# Patient Record
Sex: Female | Born: 1965 | Race: Asian | Hispanic: No | Marital: Married | Smoking: Never smoker
Health system: Southern US, Community
[De-identification: ages and names within clinical notes are randomized; demographics above are authoritative.]

## PROBLEM LIST (undated history)

## (undated) DIAGNOSIS — E785 Hyperlipidemia, unspecified: Secondary | ICD-10-CM

## (undated) DIAGNOSIS — K8020 Calculus of gallbladder without cholecystitis without obstruction: Secondary | ICD-10-CM

## (undated) DIAGNOSIS — E119 Type 2 diabetes mellitus without complications: Secondary | ICD-10-CM

## (undated) HISTORY — DX: Type 2 diabetes mellitus without complications: E11.9

## (undated) HISTORY — PX: LAPAROSCOPY: SHX197

## (undated) HISTORY — DX: Hyperlipidemia, unspecified: E78.5

---

## 1997-12-19 ENCOUNTER — Inpatient Hospital Stay (HOSPITAL_COMMUNITY): Admission: AD | Admit: 1997-12-19 | Discharge: 1997-12-19 | Payer: Self-pay | Admitting: Obstetrics and Gynecology

## 1998-05-07 ENCOUNTER — Encounter: Admission: RE | Admit: 1998-05-07 | Discharge: 1998-08-05 | Payer: Self-pay | Admitting: Obstetrics and Gynecology

## 1998-07-10 ENCOUNTER — Inpatient Hospital Stay (HOSPITAL_COMMUNITY): Admission: AD | Admit: 1998-07-10 | Discharge: 1998-07-13 | Payer: Self-pay | Admitting: Obstetrics and Gynecology

## 1999-10-17 ENCOUNTER — Observation Stay (HOSPITAL_COMMUNITY): Admission: AD | Admit: 1999-10-17 | Discharge: 1999-10-18 | Payer: Self-pay | Admitting: Internal Medicine

## 1999-10-18 ENCOUNTER — Encounter: Payer: Self-pay | Admitting: Gynecology

## 1999-11-19 ENCOUNTER — Encounter: Admission: RE | Admit: 1999-11-19 | Discharge: 2000-02-17 | Payer: Self-pay | Admitting: Gynecology

## 2000-04-08 ENCOUNTER — Encounter: Admission: RE | Admit: 2000-04-08 | Discharge: 2000-07-07 | Payer: Self-pay | Admitting: Gynecology

## 2000-05-05 ENCOUNTER — Inpatient Hospital Stay (HOSPITAL_COMMUNITY): Admission: AD | Admit: 2000-05-05 | Discharge: 2000-05-07 | Payer: Self-pay | Admitting: Gynecology

## 2000-06-25 ENCOUNTER — Other Ambulatory Visit: Admission: RE | Admit: 2000-06-25 | Discharge: 2000-06-25 | Payer: Self-pay | Admitting: Gynecology

## 2001-07-01 ENCOUNTER — Other Ambulatory Visit: Admission: RE | Admit: 2001-07-01 | Discharge: 2001-07-01 | Payer: Self-pay | Admitting: Gynecology

## 2001-11-13 ENCOUNTER — Encounter: Payer: Self-pay | Admitting: Gynecology

## 2001-11-13 ENCOUNTER — Inpatient Hospital Stay (HOSPITAL_COMMUNITY): Admission: AD | Admit: 2001-11-13 | Discharge: 2001-11-13 | Payer: Self-pay | Admitting: Gynecology

## 2002-07-21 ENCOUNTER — Other Ambulatory Visit: Admission: RE | Admit: 2002-07-21 | Discharge: 2002-07-21 | Payer: Self-pay | Admitting: Gynecology

## 2003-07-27 ENCOUNTER — Other Ambulatory Visit: Admission: RE | Admit: 2003-07-27 | Discharge: 2003-07-27 | Payer: Self-pay | Admitting: Gynecology

## 2003-09-05 ENCOUNTER — Ambulatory Visit (HOSPITAL_BASED_OUTPATIENT_CLINIC_OR_DEPARTMENT_OTHER): Admission: RE | Admit: 2003-09-05 | Discharge: 2003-09-05 | Payer: Self-pay | Admitting: Gynecology

## 2004-08-27 ENCOUNTER — Other Ambulatory Visit: Admission: RE | Admit: 2004-08-27 | Discharge: 2004-08-27 | Payer: Self-pay | Admitting: Gynecology

## 2005-06-17 ENCOUNTER — Encounter: Admission: RE | Admit: 2005-06-17 | Discharge: 2005-06-17 | Payer: Self-pay | Admitting: Family Medicine

## 2006-03-19 ENCOUNTER — Other Ambulatory Visit: Admission: RE | Admit: 2006-03-19 | Discharge: 2006-03-19 | Payer: Self-pay | Admitting: Obstetrics and Gynecology

## 2010-12-18 ENCOUNTER — Encounter: Payer: Self-pay | Admitting: Family Medicine

## 2010-12-18 ENCOUNTER — Ambulatory Visit (INDEPENDENT_AMBULATORY_CARE_PROVIDER_SITE_OTHER): Payer: Self-pay | Admitting: Family Medicine

## 2010-12-18 DIAGNOSIS — Z0000 Encounter for general adult medical examination without abnormal findings: Secondary | ICD-10-CM

## 2010-12-18 DIAGNOSIS — D509 Iron deficiency anemia, unspecified: Secondary | ICD-10-CM | POA: Insufficient documentation

## 2010-12-18 DIAGNOSIS — N938 Other specified abnormal uterine and vaginal bleeding: Secondary | ICD-10-CM | POA: Insufficient documentation

## 2010-12-18 DIAGNOSIS — N926 Irregular menstruation, unspecified: Secondary | ICD-10-CM

## 2010-12-18 NOTE — Assessment & Plan Note (Signed)
Pt complains of irregular menses x 5 months.  Her menstrual cycle would skip a month and the next month it may last up to 2 wks.  She has always been regular in the past.  Her mother experienced menopause in her early 54s.  Pt may be in perimenopausal stage.  She has no history of abnormal pap smear, with last one in 04/2010 being normal (per pt). She does not have insurance but will go to apply for assistance today Jaynee Eagles).  Pt is asymptomatic and is no menstruating currently.  Will not check Hb today.

## 2010-12-18 NOTE — Assessment & Plan Note (Signed)
Last MMG was 2010.  Pt applying for [facility] card today. Will schedule for MMG as soon as she gets card.

## 2010-12-18 NOTE — Assessment & Plan Note (Signed)
Per pt's report she is taking FeSO3 325mg  daily.  Will check Cbc when pt has [facility] card.  Pt states she will make appt with me as soon as she has card.

## 2010-12-18 NOTE — Progress Notes (Signed)
  Subjective:    Patient ID: Theresa Diaz, female    DOB: Jan 12, 1966, 45 y.o.   MRN: [id number]  HPI This is [name]'s New Patient appt to our office.  She feels well but is concerned about irregular menses.  Irregular menses: Since 07/2010 (5 months) pt has had irregular menses.  She would have a cycle one month then skip a month.  When her cycle comes back it may last about 2 wks.  She also has abd cramping with menstrual cycle.  She thinks that her mother experienced menopause in her early 7s.     Review of Systems  Constitutional: Positive for activity change. Negative for fever, appetite change, fatigue and unexpected weight change.  HENT: Negative.   Respiratory: Negative for cough, choking, chest tightness and shortness of breath.   Cardiovascular: Negative for chest pain, palpitations and leg swelling.  Gastrointestinal: Negative for nausea, vomiting, abdominal pain, diarrhea, constipation, blood in stool, abdominal distention, anal bleeding and rectal pain.  Genitourinary: Positive for menstrual problem. Negative for dysuria, frequency, vaginal discharge, difficulty urinating, pelvic pain and dyspareunia.  Musculoskeletal: Negative.   Skin: Negative.   Hematological: Negative.        Objective:   Physical Exam  Constitutional: She is oriented to person, place, and time. She appears well-developed and well-nourished. No distress.  HENT:  Head: Normocephalic and atraumatic.  Right Ear: External ear normal.  Left Ear: External ear normal.  Nose: Nose normal.  Mouth/Throat: Oropharynx is clear and moist. No oropharyngeal exudate.  Eyes: Conjunctivae and EOM are normal. Pupils are equal, round, and reactive to light.  Neck: Normal range of motion. Neck supple. No thyromegaly present.  Cardiovascular: Normal rate, regular rhythm, normal heart sounds and intact distal pulses.   No murmur heard. Pulmonary/Chest: Effort normal and breath sounds normal. She has no wheezes.    Abdominal: Soft. Bowel sounds are normal. She exhibits no distension. There is no tenderness.  Musculoskeletal: She exhibits no edema.  Neurological: She is alert and oriented to person, place, and time.  Skin: No rash noted.          Assessment & Plan:

## 2011-01-08 ENCOUNTER — Ambulatory Visit (INDEPENDENT_AMBULATORY_CARE_PROVIDER_SITE_OTHER): Payer: Self-pay | Admitting: Family Medicine

## 2011-01-08 ENCOUNTER — Other Ambulatory Visit: Payer: Self-pay | Admitting: Family Medicine

## 2011-01-08 ENCOUNTER — Encounter: Payer: Self-pay | Admitting: Family Medicine

## 2011-01-08 DIAGNOSIS — D509 Iron deficiency anemia, unspecified: Secondary | ICD-10-CM

## 2011-01-08 DIAGNOSIS — N926 Irregular menstruation, unspecified: Secondary | ICD-10-CM

## 2011-01-08 DIAGNOSIS — Z1231 Encounter for screening mammogram for malignant neoplasm of breast: Secondary | ICD-10-CM

## 2011-01-08 DIAGNOSIS — Z0000 Encounter for general adult medical examination without abnormal findings: Secondary | ICD-10-CM

## 2011-01-08 LAB — CBC
HCT: 41.3 % (ref 36.0–46.0)
Hemoglobin: 13.9 g/dL (ref 12.0–15.0)
MCH: 28.8 pg (ref 26.0–34.0)
MCHC: 33.7 g/dL (ref 30.0–36.0)
MCV: 85.7 fL (ref 78.0–100.0)
Platelets: 425 10*3/uL — ABNORMAL HIGH (ref 150–400)
RBC: 4.82 MIL/uL (ref 3.87–5.11)
RDW: 14.6 % (ref 11.5–15.5)
WBC: 6.3 10*3/uL (ref 4.0–10.5)

## 2011-01-08 LAB — BASIC METABOLIC PANEL
CO2: 21 mEq/L (ref 19–32)
Calcium: 9.3 mg/dL (ref 8.4–10.5)
Glucose, Bld: 98 mg/dL (ref 70–99)
Potassium: 4 mEq/L (ref 3.5–5.3)
Sodium: 138 mEq/L (ref 135–145)

## 2011-01-08 LAB — IRON AND TIBC: %SAT: 21 % (ref 20–55)

## 2011-01-08 NOTE — Progress Notes (Signed)
  Subjective:    Patient ID: Theresa Diaz, female    DOB: [birth year], 45 y.o.   MRN: [id number]  HPI Menstrual problem: For the past 6 months her menstrual cycle has been irregular.  Usually she has cycle that lasts about 7-10 days.  Since Dec 2011 she has had alternating months when she would not have a menstrual cycle.  Dec no cycle, Jan cycle, Feb no cycle, Mar cycle, April no cycle, May cycle.  The cycle in May lasted 7 days.  She has noticed blood when she wipes after voiding.  The blood is deep red.  She denies dysuria, frequency, urgency, pelvic pain, vaginal pain, abd pain, pain with intercourse, fever, chills, nausea, vomiting, diarrhea.  She denies history of abnormal pap smear.  Her last pap was performed at a GYN office on [address].   Her mother experienced menopause during her 48s.  Pt does not have time today for pap.  She would like to return next week for pap and pelvic exam.    Iron def anemia: Pt admits to not taking iron bid as directed.  She is not sure that she has iron def anemia.  She was prescribed iron by her previous physician.  Denies fatigue, dyspnea, syncope, palpitations, syncope.  Review of Systems Per hpi     Objective:   Physical Exam  Constitutional: She is oriented to person, place, and time. She appears well-developed and well-nourished. No distress.  Neurological: She is alert and oriented to person, place, and time.          Assessment & Plan:

## 2011-01-08 NOTE — Patient Instructions (Signed)
Please make appt with Dr Janalyn Harder for next week for pap smear.   Please get mammogram.

## 2011-01-08 NOTE — Assessment & Plan Note (Signed)
Pt was Rx iron by previous physician but takes it very irregularly.  Will check cbc and fe panel today.

## 2011-01-08 NOTE — Assessment & Plan Note (Signed)
Pt endorses irregular menstrual cycles x 6 months, with alternating month when she would not have a period.  We will check CBC today.  Pt does not have time today for pelvic exam and pap.  Last pap was some time last year.  She denies history of abnormal pap.  She will rtc in 1 wk for pap and pelvic exam. Pt's mother experienced menopause in her 80s so it may be that pt is perimenopausal, but I would like to have pap to rule out malignant cause of current complaint.

## 2011-01-08 NOTE — Assessment & Plan Note (Signed)
Gave her info to schedule for MMG at [facility].  Will perform pap at next visit.

## 2011-01-14 ENCOUNTER — Encounter: Payer: Self-pay | Admitting: Family Medicine

## 2011-01-14 ENCOUNTER — Other Ambulatory Visit (HOSPITAL_COMMUNITY)
Admission: RE | Admit: 2011-01-14 | Discharge: 2011-01-14 | Disposition: A | Discharge status: Home / Self Care | Payer: Self-pay | Source: Ambulatory Visit | Attending: Family Medicine | Admitting: Family Medicine

## 2011-01-14 ENCOUNTER — Ambulatory Visit (INDEPENDENT_AMBULATORY_CARE_PROVIDER_SITE_OTHER): Payer: Self-pay | Admitting: Family Medicine

## 2011-01-14 VITALS — BP 107/73 | HR 69 | Temp 97.9°F | Ht 59.75 in | Wt 138.0 lb

## 2011-01-14 DIAGNOSIS — Z124 Encounter for screening for malignant neoplasm of cervix: Secondary | ICD-10-CM

## 2011-01-14 DIAGNOSIS — Z01419 Encounter for gynecological examination (general) (routine) without abnormal findings: Secondary | ICD-10-CM | POA: Insufficient documentation

## 2011-01-14 DIAGNOSIS — Z1231 Encounter for screening mammogram for malignant neoplasm of breast: Secondary | ICD-10-CM

## 2011-01-14 NOTE — Progress Notes (Signed)
Subjective:    Patient ID: Theresa Diaz, female    DOB: Jul 20, 1966, 45 y.o.   MRN: [id number]  HPI LMP: 12/10/10 No vaginal discharge, no lesions No contraception  MMG: pt has not gotten MMG yet. States that she has appt next week.   Review of Systems  Constitutional: Negative for chills, fatigue and unexpected weight change.  Respiratory: Negative for cough, choking and wheezing.   Cardiovascular: Negative for chest pain.  Gastrointestinal: Negative for nausea, vomiting, diarrhea, constipation and blood in stool.  Genitourinary: Negative for dysuria, frequency, hematuria, vaginal discharge and vaginal pain.  Musculoskeletal: Negative for back pain and gait problem.  Skin: Negative for rash.  Neurological: Negative for syncope, weakness and numbness.  Hematological: Does not bruise/bleed easily.  Psychiatric/Behavioral: Negative.        Objective:   Physical Exam  Constitutional: She is oriented to person, place, and time. She appears well-developed and well-nourished. No distress.  HENT:  Head: Atraumatic.  Neck: Normal range of motion. No thyromegaly present.  Cardiovascular: Normal rate, regular rhythm, normal heart sounds and intact distal pulses.   No murmur heard. Pulmonary/Chest: Effort normal and breath sounds normal. She has no wheezes.  Abdominal: Soft. Bowel sounds are normal. She exhibits no distension and no mass. There is no tenderness.  Genitourinary: There is no rash or lesion on the right labia. There is no rash or lesion on the left labia. Cervix exhibits friability. Cervix exhibits no motion tenderness. Right adnexum displays no mass and no tenderness. Left adnexum displays no mass and no tenderness. No signs of injury around the vagina. No vaginal discharge found.         3 cysts seen on cervix, at 1 o'clock, 9 o'clock, and 6 o'clock.  Musculoskeletal: Normal range of motion. She exhibits no edema and no tenderness.  Lymphadenopathy:    She has no cervical  adenopathy.       Right: No inguinal adenopathy present.       Left: No inguinal adenopathy present.  Neurological: She is alert and oriented to person, place, and time.  Psychiatric: She has a normal mood and affect.          Assessment & Plan:   Subjective:     Theresa Diaz is a 45 y.o. female and is here for a comprehensive physical exam. The patient reports no problems.  History   Social History  . Marital Status: Married    Spouse Name: Theresa Diaz    Number of Children: 2  . Years of Education: 12   Occupational History  . Waitress    Social History Main Topics  . Smoking status: Never Smoker   . Smokeless tobacco: Never Used  . Alcohol Use: No  . Drug Use: No  . Sexually Active: Yes   Other Topics Concern  . Not on file   Social History Narrative   Lives in [place] with husband Theresa Diaz, [place]) and her two sons Theresa Diaz 1999, Theresa Diaz 2001).Works as a Child psychotherapist, part time.Speaks [place] and [place]. Cell [phone]   Health Maintenance  Topic Date Due  . Tetanus/tdap  06/14/1986  . Pap Smear  04/10/2012    The following portions of the patient's history were reviewed and updated as appropriate: allergies, current medications, past family history, past medical history, past social history, past surgical history and problem list.  Review of Systems Pertinent items are noted in HPI.   Objective:    General appearance: alert, cooperative and  appears stated age Head: Normocephalic, without obvious abnormality, atraumatic Throat: lips, mucosa, and tongue normal; teeth and gums normal Neck: no adenopathy, no carotid bruit, no JVD, supple, symmetrical, trachea midline and thyroid not enlarged, symmetric, no tenderness/mass/nodules Lungs: clear to auscultation bilaterally Heart: regular rate and rhythm, S1, S2 normal, no murmur, click, rub or gallop Abdomen: soft, non-tender; bowel sounds normal; no masses,  no organomegaly Pelvic: cervix normal in  appearance, external genitalia normal, no adnexal masses or tenderness, no cervical motion tenderness, rectovaginal septum normal, uterus normal size, shape, and consistency and vagina normal without discharge Pulses: 2+ and symmetric Skin: Skin color, texture, turgor normal. No rashes or lesions    Assessment:    Healthy female exam.       Plan:

## 2011-01-15 ENCOUNTER — Encounter: Payer: Self-pay | Admitting: Family Medicine

## 2011-01-21 ENCOUNTER — Ambulatory Visit (HOSPITAL_COMMUNITY)
Admission: RE | Admit: 2011-01-21 | Discharge: 2011-01-21 | Disposition: A | Discharge status: Home / Self Care | Payer: Self-pay | Source: Ambulatory Visit | Attending: Family Medicine | Admitting: Family Medicine

## 2011-01-21 DIAGNOSIS — Z1231 Encounter for screening mammogram for malignant neoplasm of breast: Secondary | ICD-10-CM | POA: Insufficient documentation

## 2011-06-15 ENCOUNTER — Ambulatory Visit (INDEPENDENT_AMBULATORY_CARE_PROVIDER_SITE_OTHER): Payer: Self-pay | Admitting: Family Medicine

## 2011-06-15 ENCOUNTER — Encounter: Payer: Self-pay | Admitting: Family Medicine

## 2011-06-15 ENCOUNTER — Other Ambulatory Visit: Payer: Self-pay | Admitting: Family Medicine

## 2011-06-15 VITALS — BP 120/78 | HR 91 | Temp 98.4°F | Ht 59.75 in | Wt 135.0 lb

## 2011-06-15 DIAGNOSIS — N926 Irregular menstruation, unspecified: Secondary | ICD-10-CM

## 2011-06-15 NOTE — Progress Notes (Signed)
  Subjective:    Patient ID: Theresa Diaz, female    DOB: 09/12/1965, 45 y.o.   MRN: [id number]  HPI 45 yo female who presents with dysfunctional uterine bleeding. She reports having period for the last two weeks. It just stopped today. For 8-9 days of that time, she required up to 13 pads. She denies any abdominal pain associated with this. She has been having intermittent skipped periods for the last 9 months. May: no menses, June: menses, July: no menses, August: menses, September: 1-2 days of spotting. She reports not ever having a period that has lasted this long. Her normal menses would last up to 7 days where she would use 3-4 pads/day. They used to be regular, every 28days. Menarche was at 45 yo. She is sexually active. She does occasionally have hot flashes that started 4-5 months ago. She has been experiencing fatigue and some lightheadedness in the last 2 weeks. Her mother had menopause in her 37's. She denies any family history of uterine, ovarian, colon or breast cancer. She has two children.  She denies any hematuria, any dysuria.  She had a pap smear in June 2012 which was normal. She also had iron studies and CBC which were normal at the time.    Review of Systems Negative except per HPI.     Objective:   Physical Exam Physical Examination: General appearance - alert, well appearing, and in no distress Eyes - pupils equal and reactive, extraocular eye movements intact, red conjuctiva Chest - clear to auscultation, no wheezes, rales or rhonchi, symmetric air entry Heart - normal rate, regular rhythm, normal S1, S2, no murmurs, rubs, clicks or gallops Abdomen - soft, nontender, nondistended, no masses or organomegaly Extremities - peripheral pulses normal, no pedal edema, no clubbing or cyanosis       Assessment & Plan:

## 2011-06-15 NOTE — Patient Instructions (Signed)
I am checking blood tests to make sure that your hemoglobin and your thyroid are normal.  This is probably just the beginning of menopause but to make sure that it's not something mor serious, we're going to take samples of cells called:endometrial biopsy in 2-3 weeks. So make appointment in 2-3 weeks for that.

## 2011-06-15 NOTE — Assessment & Plan Note (Addendum)
Perimenopause vs endometrial hyperplasia/carcinoma: in light of her family history of early menopause as well as with her description of hot flashes, this is most likely perimenopausal. However, it could also be endometrial hyperplasia/cancer (despite no risk factors such as family history of endometrial, cervical, ovarian, breast or colon cancers, despite no obesity). Patient was asked to reschedule appointment for endometrial biopsy in 2-3 weeks. If bleeding starts again, we can start her on progesterone. Will also check TSH and prolactin. Will also check CBC since she had significant bleeding and did report some symptoms. Pt advised to continue taking her iron.

## 2011-06-16 ENCOUNTER — Other Ambulatory Visit: Payer: Self-pay | Admitting: Family Medicine

## 2011-06-16 DIAGNOSIS — D509 Iron deficiency anemia, unspecified: Secondary | ICD-10-CM

## 2011-06-16 LAB — PROLACTIN: Prolactin: 10.2 ng/mL

## 2011-06-16 LAB — CBC
HCT: 26.2 % — ABNORMAL LOW (ref 36.0–46.0)
Hemoglobin: 8.2 g/dL — ABNORMAL LOW (ref 12.0–15.0)
RBC: 2.99 MIL/uL — ABNORMAL LOW (ref 3.87–5.11)
WBC: 6 10*3/uL (ref 4.0–10.5)

## 2011-06-16 MED ORDER — FERROUS SULFATE 325 (65 FE) MG PO TABS: 325.0000 mg | ORAL_TABLET | Freq: Three times a day (TID) | ORAL | Status: DC

## 2011-06-16 NOTE — Telephone Encounter (Signed)
Called patient to let her know that her hemoglobin was low and to start taking ferrous sulfate three times daily, not with meals initially. If she starts developing upper GI symptoms, can start taking it with meals. Also added Retic and ferritin, TIBC to her CBC.  Patient has appointment on Thursday November 15th for endometrial biopsy. Will recheck CBC, retic and Ferritin then.

## 2011-06-17 LAB — IRON AND TIBC
%SAT: 4 % — ABNORMAL LOW (ref 20–55)
Iron: 14 ug/dL — ABNORMAL LOW (ref 42–145)
TIBC: 338 ug/dL (ref 250–470)
UIBC: 324 ug/dL (ref 125–400)

## 2011-06-25 ENCOUNTER — Encounter: Payer: Self-pay | Admitting: Family Medicine

## 2011-06-25 ENCOUNTER — Ambulatory Visit (INDEPENDENT_AMBULATORY_CARE_PROVIDER_SITE_OTHER): Payer: Self-pay | Admitting: Family Medicine

## 2011-06-25 VITALS — BP 108/76 | HR 86 | Temp 98.2°F | Ht 59.75 in | Wt 139.0 lb

## 2011-06-25 DIAGNOSIS — N949 Unspecified condition associated with female genital organs and menstrual cycle: Secondary | ICD-10-CM

## 2011-06-25 DIAGNOSIS — D509 Iron deficiency anemia, unspecified: Secondary | ICD-10-CM

## 2011-06-25 DIAGNOSIS — N938 Other specified abnormal uterine and vaginal bleeding: Secondary | ICD-10-CM

## 2011-06-25 LAB — POCT URINE PREGNANCY: Preg Test, Ur: NEGATIVE

## 2011-06-26 NOTE — Progress Notes (Signed)
  Subjective:    Patient ID: Theresa Diaz, female    DOB: 1965-11-16, 45 y.o.   MRN: [id number]  HPI 45 year old female with dysfunctional uterine bleeding who presented for followup to get endometrial biopsy. However, upon signing of consent forms, patient expressed not wanting to have procedure done in the office. She did not seem to understand from her previous visit that this was a biopsy of her uterus. She said that she did not think that anything was wrong. she said that this was most likely due to menopause and that a lot of her friends and family had gone through similar situation than her. With the use of the interpreter phone for [place] she was able to communicate that she did not think that there was any need for any further workup. She denied any pain and said that when she did have some pain she would be more concerned for cancer.  She denied any current bleeding. The last time she bled was at her last office visit. She reports having been taking the iron 3 times a day. She denies any constipation and has been eating a lot of fruits and vegetables which she attributes to helping with her constipation. she denies any lightheadedness or dizziness or significant fatigue.   Review of Systems    negative except per history of present illness Objective:   Physical Exam  Filed Vitals:   06/25/11 1002  BP: 108/76  Pulse: 86  Temp: 98.2 F (36.8 C)   Physical Examination: Eyes - conjunctiva not pale Chest - clear to auscultation, no wheezes, rales or rhonchi, symmetric air entry Heart - normal rate, regular rhythm, normal S1, S2, no murmurs, rubs, clicks or gallops       Assessment & Plan:

## 2011-06-26 NOTE — Assessment & Plan Note (Addendum)
Patient no longer symptomatic and not actively bleeding. Continue ferrous sulfate 325 mg 3 times daily. Recheck CBC in 2-3 months.

## 2011-06-26 NOTE — Assessment & Plan Note (Signed)
Discussed patient with Dr. Jennette Kettle. Patient is being evaluated for dysfunctional uterine bleeding that could be anovulatory in nature in which case endometrial biopsy would be needed. Patient was explained of the possibility of uterine dysplasia or cancer with irregular bleeding. However since patient's did not think that this was a serious problem other than menopause she did not want to have any biopsy at the moment. She did say that if things were to worsen she would consider it. Patient did agree to having ultrasound done. This would be done to evaluate for any fibroids or to evaluate for a large endometrial stripe. It could also be that the patient is perimenopausal. Will continue with ferrous sulfate 325 mg 3 times a day since patient is tolerating it well. Will repeat CBC in 2-3 months.

## 2011-07-07 ENCOUNTER — Ambulatory Visit (HOSPITAL_COMMUNITY): Payer: Self-pay

## 2011-08-25 ENCOUNTER — Ambulatory Visit: Payer: Self-pay | Admitting: Family Medicine

## 2011-09-04 ENCOUNTER — Encounter: Payer: Self-pay | Admitting: Family Medicine

## 2011-09-04 ENCOUNTER — Ambulatory Visit (INDEPENDENT_AMBULATORY_CARE_PROVIDER_SITE_OTHER): Payer: Self-pay | Admitting: Family Medicine

## 2011-09-04 VITALS — BP 124/81 | HR 84 | Ht 59.75 in | Wt 137.6 lb

## 2011-09-04 DIAGNOSIS — D509 Iron deficiency anemia, unspecified: Secondary | ICD-10-CM

## 2011-09-04 DIAGNOSIS — D649 Anemia, unspecified: Secondary | ICD-10-CM

## 2011-09-04 DIAGNOSIS — R42 Dizziness and giddiness: Secondary | ICD-10-CM

## 2011-09-04 NOTE — Patient Instructions (Addendum)
I return in 1 month to see Dr. Gwenlyn Saran.  Or sooner if  Any new or worsening of symptoms. Keep a log book/paper- the date of when dizziness occurs, how long it lasted and other symptoms that occurred at the same, what were you doing, how did you make it go away.  Keep taking iron.  I will check your blood today to see if anemia is improving.

## 2011-09-09 DIAGNOSIS — R42 Dizziness and giddiness: Secondary | ICD-10-CM | POA: Insufficient documentation

## 2011-09-09 NOTE — Progress Notes (Signed)
  Subjective:    Patient ID: Theresa Diaz, female    DOB: April 26, 1966, 46 y.o.   MRN: [id number]  HPI Dizziness: Patient reports dizziness off and on x2 years. But seem to be getting more frequent. Episodes occur now 4-5 times per week. Last approximately one to 2 minutes and then resolve spontaneously. No symptoms of vertigo. No dizziness upon standing. Patient states that bright light and bright colors can bring the dizziness on. Looking away from bright lights and colors will make the dizziness improved. No blurry vision. No vision loss. No headaches. No history of seizures. No dark or bloody stools. Has had cold symptoms off and on since around Christmas. Does have a history of anemia. No syncope. No falls. No temporal pain. Lifting arms above head or lifting her shoulder does not provoke symptoms.   Review of Systems As per above.    Objective:   Physical Exam  Constitutional: She appears well-developed and well-nourished.  HENT:  Head: Normocephalic and atraumatic.  Right Ear: External ear normal.  Left Ear: External ear normal.  Nose: Nose normal.  Mouth/Throat: Oropharynx is clear and moist. No oropharyngeal exudate.  Eyes: Conjunctivae and EOM are normal. Pupils are equal, round, and reactive to light. Right eye exhibits no discharge. Left eye exhibits no discharge. No scleral icterus.  Neck: Normal range of motion. Neck supple.  Pulmonary/Chest: Effort normal and breath sounds normal. No respiratory distress. She has no wheezes.  Abdominal: Soft. She exhibits no distension.  Lymphadenopathy:    She has no cervical adenopathy.  Neurological: She is alert. She has normal reflexes. She displays normal reflexes. No cranial nerve deficit. She exhibits normal muscle tone. Coordination normal.       Dix-Hallpike maneuver negative.  No nystagmus.  Psychiatric: She has a normal mood and affect.          Assessment & Plan:

## 2011-09-09 NOTE — Assessment & Plan Note (Addendum)
Will recheck CBC today to make sure that the anemia improved-anemia can contribute to dizziness.  Patient is to continue iron. PCP to decide if this should be DC'd after CBC return.

## 2011-09-09 NOTE — Assessment & Plan Note (Signed)
Cause of dizziness unclear at this time. I do not think that this is an emergent issue since it has been going on x2 years. Also neuro exam within normal limits. Patient is to return in 1 month to see Dr. Alinda Sierras primary care doctor.  Or sooner if  Any new or worsening of symptoms. For the next month-patient is to Keep a log of: the date of when dizziness occurs, how long it lasted and other symptoms that occurred at the same, what were you doing, how did you make it go away.

## 2011-10-08 ENCOUNTER — Encounter: Payer: Self-pay | Admitting: Family Medicine

## 2011-10-08 ENCOUNTER — Ambulatory Visit (INDEPENDENT_AMBULATORY_CARE_PROVIDER_SITE_OTHER): Payer: Self-pay | Admitting: Family Medicine

## 2011-10-08 DIAGNOSIS — D649 Anemia, unspecified: Secondary | ICD-10-CM

## 2011-10-08 DIAGNOSIS — Z0000 Encounter for general adult medical examination without abnormal findings: Secondary | ICD-10-CM

## 2011-10-08 DIAGNOSIS — N938 Other specified abnormal uterine and vaginal bleeding: Secondary | ICD-10-CM

## 2011-10-08 DIAGNOSIS — N926 Irregular menstruation, unspecified: Secondary | ICD-10-CM

## 2011-10-08 NOTE — Patient Instructions (Signed)
I will call you with the results of the blood test. If you are still anemic, we will continue with the iron. If not, no more need for iron.

## 2011-10-09 LAB — LDL CHOLESTEROL, DIRECT: Direct LDL: 137 mg/dL — ABNORMAL HIGH

## 2011-10-09 LAB — CBC
HCT: 33.6 % — ABNORMAL LOW (ref 36.0–46.0)
Hemoglobin: 10.1 g/dL — ABNORMAL LOW (ref 12.0–15.0)
MCHC: 30.1 g/dL (ref 30.0–36.0)
RBC: 4.45 MIL/uL (ref 3.87–5.11)
WBC: 7.5 10*3/uL (ref 4.0–10.5)

## 2011-10-10 NOTE — Assessment & Plan Note (Signed)
Dysfunctional bleeding appears to have resolved. At 45yo this could be due to irregular bleeding from menopause. Patient never got transvaginal ultrasound, which she should probably obtain at some point.  Will check repeat CBC to assess anemia and whether she needs to take more iron.

## 2011-10-10 NOTE — Progress Notes (Signed)
Patient ID: Christianne Borrow, female   DOB: 02-19-1966, 46 y.o.   MRN: [id number] Patient ID: Rozena Fierro    DOB: 1965-11-15, 46 y.o.   MRN: [id number] --- Subjective:  Allyssa is a 46 y.o.female who presents for follow up on vaginal bleeding and anemia that occurred in November 2012. She has been taking iron 2-3 times per week. She denies any lightheadedness and fatigue. She has not had any more episodes of vaginal bleeding. Reports normal periods.  She has not gone to get transvaginal ultrasound. She denies any recent weight loss or abdominal pain.    Objective: Filed Vitals:   10/08/11 1630  BP: 120/80  Pulse: 80    Physical Examination:   General appearance - alert, well appearing, and in no distress Eyes - pink palpebral conjunctiva Chest - clear to auscultation, no wheezes, rales or rhonchi, symmetric air entry Heart - normal rate, regular rhythm, normal S1, S2, no murmurs, rubs, clicks or gallops Abdomen - soft, nontender, nondistended, no masses or organomegaly Extremities - peripheral pulses normal, no pedal edema, no clubbing or cyanosis

## 2011-10-12 ENCOUNTER — Telehealth: Payer: Self-pay | Admitting: Family Medicine

## 2011-10-12 DIAGNOSIS — D509 Iron deficiency anemia, unspecified: Secondary | ICD-10-CM

## 2011-10-12 MED ORDER — FERROUS SULFATE 325 (65 FE) MG PO TABS: 325.0000 mg | ORAL_TABLET | Freq: Three times a day (TID) | ORAL | Status: DC

## 2011-10-12 NOTE — Telephone Encounter (Signed)
Called patient to let her know that her hemoglobin was still low at 10.1, although improved compared to previous at 8.4. She denies any lightheadedness and dizziness. She states that her period is back to normal. Advised patient to return in 1 month to check CBC again.

## 2011-12-01 ENCOUNTER — Ambulatory Visit (INDEPENDENT_AMBULATORY_CARE_PROVIDER_SITE_OTHER): Payer: Self-pay | Admitting: Family Medicine

## 2011-12-01 ENCOUNTER — Encounter: Payer: Self-pay | Admitting: Family Medicine

## 2011-12-01 ENCOUNTER — Ambulatory Visit (HOSPITAL_COMMUNITY)
Admission: RE | Admit: 2011-12-01 | Discharge: 2011-12-01 | Disposition: A | Discharge status: Home / Self Care | Payer: Self-pay | Source: Ambulatory Visit | Attending: Family Medicine | Admitting: Family Medicine

## 2011-12-01 VITALS — BP 113/78 | HR 85 | Temp 98.0°F | Ht 59.75 in | Wt 143.0 lb

## 2011-12-01 DIAGNOSIS — R42 Dizziness and giddiness: Secondary | ICD-10-CM | POA: Insufficient documentation

## 2011-12-01 LAB — CBC
Hemoglobin: 12.1 g/dL (ref 12.0–15.0)
MCHC: 31.4 g/dL (ref 30.0–36.0)
Platelets: 406 10*3/uL — ABNORMAL HIGH (ref 150–400)
RDW: 23.7 % — ABNORMAL HIGH (ref 11.5–15.5)

## 2011-12-01 NOTE — Assessment & Plan Note (Addendum)
Cause of dizziness still unclear. She does not complain of palpitations, chest pain, shortness of breath. She has a normal O2 sat. she is a normal EKG. She is a normal neuro exam. She is a normal gait with walking. Patient states that she has some blurry vision with distance. However, visual acuity was normal today. Her blood pressure does not drop with standing. I will check a CBC because she has a history of anemia. She has not been taking her iron as prescribed. I will have her followup with her PCP next week.

## 2011-12-01 NOTE — Patient Instructions (Signed)
Your EKG looks fine I will send you to check your blood counts I want you to return in one to 2 weeks to see Dr. Gwenlyn Saran I will call you if I need you to restart your iron  If this dizziness gets worse or doesn't go away immediately, give Theresa Diaz a call or be seen for evaluation

## 2011-12-01 NOTE — Progress Notes (Signed)
  Subjective:    Patient ID: Theresa Diaz, female    DOB: [birth year], 46 y.o.   MRN: [id number]  HPI Patient presents today with dizziness. She's had this complaint before and was evaluated by Dr. Edmonia James but it got better. She has been experiencing this again over last 3 weeks. When she is walking she occasionally feels like she is going to fall. She says that there are spots in her vision and she has blurred vision when looking at distances. This gets better when she closes her eyes and weights for a moment. She denies chest pain, shortness of breath, weakness when this happens. She has not experienced any weakness in her hands or feet. She does not think her muscles are weak. She does not think her dizziness is positional. It is not worse when she lies down or moves her head.  Her last period was 3/15 and lasted for 2-3 days. This month she has had some spotting but no period yet. She takes her iron once a day when she remembers. She has a history of iron deficiency anemia. Last CBC showed a hemoglobin of 10.  Review of Systems    see above, otherwise negative Objective:   Physical Exam  Vital signs reviewed General appearance - alert, well appearing, and in no distress Heart - normal rate, regular rhythm, normal S1, S2, no murmurs, rubs, clicks or gallops Chest - clear to auscultation, no wheezes, rales or rhonchi, symmetric air entry, no tachypnea, retractions or cyanosis Neurological - alert, oriented, normal speech, no focal findings or movement disorder noted, neck supple without rigidity, cranial nerves II through XII intact Gait is normal       Assessment & Plan:

## 2012-02-02 ENCOUNTER — Other Ambulatory Visit: Payer: Self-pay | Admitting: Emergency Medicine

## 2012-02-02 DIAGNOSIS — Z1231 Encounter for screening mammogram for malignant neoplasm of breast: Secondary | ICD-10-CM

## 2012-02-03 ENCOUNTER — Ambulatory Visit (HOSPITAL_COMMUNITY)
Admission: RE | Admit: 2012-02-03 | Discharge: 2012-02-03 | Disposition: A | Discharge status: Home / Self Care | Payer: Self-pay | Source: Ambulatory Visit | Attending: Family Medicine | Admitting: Family Medicine

## 2012-02-03 DIAGNOSIS — Z1231 Encounter for screening mammogram for malignant neoplasm of breast: Secondary | ICD-10-CM | POA: Insufficient documentation

## 2012-02-15 ENCOUNTER — Encounter: Payer: Self-pay | Admitting: Family Medicine

## 2012-02-15 ENCOUNTER — Other Ambulatory Visit (HOSPITAL_COMMUNITY)
Admission: RE | Admit: 2012-02-15 | Discharge: 2012-02-15 | Disposition: A | Discharge status: Home / Self Care | Payer: Self-pay | Source: Ambulatory Visit | Attending: Family Medicine | Admitting: Family Medicine

## 2012-02-15 ENCOUNTER — Ambulatory Visit (INDEPENDENT_AMBULATORY_CARE_PROVIDER_SITE_OTHER): Payer: Self-pay | Admitting: Family Medicine

## 2012-02-15 VITALS — BP 106/71 | HR 71 | Temp 98.1°F | Ht 60.0 in | Wt 142.0 lb

## 2012-02-15 DIAGNOSIS — Z0000 Encounter for general adult medical examination without abnormal findings: Secondary | ICD-10-CM

## 2012-02-15 DIAGNOSIS — Z124 Encounter for screening for malignant neoplasm of cervix: Secondary | ICD-10-CM

## 2012-02-15 DIAGNOSIS — D509 Iron deficiency anemia, unspecified: Secondary | ICD-10-CM

## 2012-02-15 DIAGNOSIS — Z01419 Encounter for gynecological examination (general) (routine) without abnormal findings: Secondary | ICD-10-CM | POA: Insufficient documentation

## 2012-02-15 NOTE — Progress Notes (Signed)
Patient ID: Theresa Diaz, female   DOB: Jun 27, 1966, 46 y.o.   MRN: [id number] Patient ID: Theresa Diaz    DOB: September 24, 1965, 46 y.o.   MRN: [id number] --- Subjective:  Theresa Diaz is a 46 y.o.female who presents for annual physical.  Denies any vaginal discharge, odor. Had some heavy bleeding earlier on this year, but this has since resolved. Currently, menses last 2 days of moderate bleeding.   Periods have been more irregular. Sometimes skips period in a month. Before this last year, was regular having periods every month. LMP: 01/18/12 No history of STD's. Sexually active with her husband. She doesn't suspect that husband has STD or is not exclusive with her.  Denies fatigue, shortness of breath, chest pain. Denies recent weight loss. Denies abdominal pain, constipation, diarrhea or blood in stool. No joint pain or skin rashes.   ROS: see HPI Past Medical History: reviewed and updated medications and allergies. Social History: Tobacco: none  Objective: Filed Vitals:   02/15/12 0846  BP: 106/71  Pulse: 71  Temp: 98.1 F (36.7 C)    Physical Examination:   General appearance - alert, well appearing, and in no distress Nose - normal and patent, mildly erythematous nasal turbinates, otherwise no discharge or polyps Mouth - mucous membranes moist, pharynx normal without lesions Neck - supple, no significant adenopathy no masses Chest - clear to auscultation, no wheezes, rales or rhonchi, symmetric air entry Heart - normal rate, regular rhythm, normal S1, S2, no murmurs, rubs, clicks or gallops Abdomen - soft, nontender, nondistended, no masses or organomegaly Extremities - peripheral pulses normal, no pedal edema, no clubbing or cyanosis Pelvic exam: normal external genitalia, vulva, vagina, cervix, uterus and adnexa. Minimal blood present at cervical os. Breast exam: no masses, tenderness or nipple discharge. Normal appearing skin  Labs and imaging: 02/03/12: mammogram: wnl  CBC      Component Value Date/Time   WBC 6.7 12/01/2011 1011   RBC 5.02 12/01/2011 1011   HGB 12.1 12/01/2011 1011   HCT 38.5 12/01/2011 1011   PLT 406* 12/01/2011 1011   MCV 76.7* 12/01/2011 1011   MCH 24.1* 12/01/2011 1011   MCHC 31.4 12/01/2011 1011   RDW 23.7* 12/01/2011 1011   CMP     Component Value Date/Time   NA 138 01/08/2011 1030   K 4.0 01/08/2011 1030   CL 107 01/08/2011 1030   CO2 21 01/08/2011 1030   GLUCOSE 98 01/08/2011 1030   BUN 7 01/08/2011 1030   CREATININE 0.63 01/08/2011 1030   CALCIUM 9.3 01/08/2011 1030

## 2012-02-15 NOTE — Patient Instructions (Signed)
It was nice to see you today! I will send you a letter with the results of the pap smear.  Also, I will send a referral for the dentist, but I don't know if it will go through. We will try.

## 2012-02-15 NOTE — Assessment & Plan Note (Signed)
Resolved. Hemoglobin at 12.1 from 8 in November. Not taking iron supplements, but eating iron rich foods. Continue with dietary modifications especially since patient is no longer having active uterine bleeding.

## 2012-02-15 NOTE — Assessment & Plan Note (Addendum)
PAP today. No indications for STD testing.  Mammogram June 2013 normal.  Patient requests referral for dental clinic for cleaning.

## 2012-02-16 ENCOUNTER — Encounter: Payer: Self-pay | Admitting: Family Medicine

## 2012-02-17 ENCOUNTER — Encounter: Payer: Self-pay | Admitting: Family Medicine

## 2012-07-27 ENCOUNTER — Ambulatory Visit (INDEPENDENT_AMBULATORY_CARE_PROVIDER_SITE_OTHER): Payer: No Typology Code available for payment source | Admitting: Family Medicine

## 2012-07-27 ENCOUNTER — Telehealth: Payer: Self-pay | Admitting: Family Medicine

## 2012-07-27 ENCOUNTER — Encounter: Payer: Self-pay | Admitting: Family Medicine

## 2012-07-27 VITALS — BP 129/80 | HR 68 | Temp 98.4°F | Ht 60.0 in | Wt 146.0 lb

## 2012-07-27 DIAGNOSIS — R42 Dizziness and giddiness: Secondary | ICD-10-CM | POA: Insufficient documentation

## 2012-07-27 LAB — POCT HEMOGLOBIN: Hemoglobin: 12.8 g/dL (ref 12.2–16.2)

## 2012-07-27 MED ORDER — MECLIZINE HCL 12.5 MG PO TABS: 12.5000 mg | ORAL_TABLET | Freq: Three times a day (TID) | ORAL | Status: DC | PRN

## 2012-07-27 MED ORDER — MECLIZINE HCL 32 MG PO TABS: 32.0000 mg | ORAL_TABLET | Freq: Three times a day (TID) | ORAL | Status: DC | PRN

## 2012-07-27 NOTE — Telephone Encounter (Signed)
Called pt. Waiting for call back. Please tell pt: Hemoglobin was normal. Pt needs to take meclizine and f/up in 4 weeks. (see instructions) .Arlyss Repress

## 2012-07-27 NOTE — Assessment & Plan Note (Addendum)
Differential includes anemia vs benign position paroxysmal vertigo vs orthostatic hypotension. No evidence of neural deficit on neuro exam. No nystagmus. Negative dix hallpike.  Check hemoglobin in context of h/o anemia. Orthostatics normal. Treat empirically with meclizine and follow up in 4 weeks or sooner if not improved.

## 2012-07-27 NOTE — Patient Instructions (Addendum)
Please follow up in 4 weeks to see how things are going.   Dizziness Dizziness is a common problem. It is a feeling of unsteadiness or lightheadedness. You may feel like you are about to faint. Dizziness can lead to injury if you stumble or fall. A person of any age group can suffer from dizziness, but dizziness is more common in older adults. CAUSES  Dizziness can be caused by many different things, including:  Middle ear problems.  Standing for too long.  Infections.  An allergic reaction.  Aging.  An emotional response to something, such as the sight of blood.  Side effects of medicines.  Fatigue.  Problems with circulation or blood pressure.  Excess use of alcohol, medicines, or illegal drug use.  Breathing too fast (hyperventilation).  An arrhythmia or problems with your heart rhythm.  Low red blood cell count (anemia).  Pregnancy.  Vomiting, diarrhea, fever, or other illnesses that cause dehydration.  Diseases or conditions such as Parkinson's disease, high blood pressure (hypertension), diabetes, and thyroid problems.  Exposure to extreme heat. DIAGNOSIS  To find the cause of your dizziness, your caregiver may do a physical exam, lab tests, radiologic imaging scans, or an electrocardiography test (ECG).  TREATMENT  Treatment of dizziness depends on the cause of your symptoms and can vary greatly. HOME CARE INSTRUCTIONS   Drink enough fluids to keep your urine clear or pale yellow. This is especially important in very hot weather. In the elderly, it is also important in cold weather.  If your dizziness is caused by medicines, take them exactly as directed. When taking blood pressure medicines, it is especially important to get up slowly.  Rise slowly from chairs and steady yourself until you feel okay.  In the morning, first sit up on the side of the bed. When this seems okay, stand slowly while holding onto something until you know your balance is  fine.  If you need to stand in one place for a long time, be sure to move your legs often. Tighten and relax the muscles in your legs while standing.  If dizziness continues to be a problem, have someone stay with you for a day or two. Do this until you feel you are well enough to stay alone. Have the person call your caregiver if he or she notices changes in you that are concerning.  Do not drive or use heavy machinery if you feel dizzy.  Do not drink alcohol. SEEK IMMEDIATE MEDICAL CARE IF:   Your dizziness or lightheadedness gets worse.  You feel nauseous or vomit.  You develop problems with talking, walking, weakness, or using your arms, hands, or legs.  You are not thinking clearly or you have difficulty forming sentences. It may take a friend or family member to determine if your thinking is normal.  You develop chest pain, abdominal pain, shortness of breath, or sweating.  Your vision changes.  You notice any bleeding.  You have side effects from medicine that seems to be getting worse rather than better. MAKE SURE YOU:   Understand these instructions.  Will watch your condition.  Will get help right away if you are not doing well or get worse. Document Released: 01/20/2001 Document Revised: 10/19/2011 Document Reviewed: 02/13/2011 [facility] Patient Information 2013 [place], [place].  [place] M?t  (Dizziness)  Hoa m?t l m?t v?n ?? ph? bi?n. ? l c?m gic khng ?n ??nh ho?c chng m?t. B?n c th? c?m th?y nh? s?p ng?t. Hoa m?t c th?  d?n ??n ch?n th??ng n?u b?n v?p ho?c t ng. B?t k? ?? tu?i no c?ng c th? b? hoa m?t, nh?ng hoa m?t ph? bi?n h?n ? ng??i l?n tu?i.  NGUYN NHN  Hoa m?t c th? gy b?i nhi?u nguyn nhn khc nhau, bao g?m:   V?n ?? tai gi?a.  ??ng qu lu.  Nhi?m trng.  Ph?n ?ng d? ?ng.  Lo ha.  M?t ph?n ?ng c?m xc v?i m?t ci g ?, ch?ng h?n nh? khi nhn th?y mu.  Tc d?ng ph? c?a thu?c.  M?t m?i.  V?n ?? v?i tu?n hon ho?c huy?t  p.  S? d?ng qu nhi?u r??u, thu?c ho?c ma ty b?t h?p php.  Th? qu nhanh.  R?i lo?n nh?p ho?c cc v?n ?? v? nh?p tim.  L??ng h?ng huy?t c?u th?p (thi?u mu).  [place].  Nn m?a, tiu ch?y, s?t ho?c cc b?nh khc gy ra m?t n??c.  B?nh ho?c ?i?u ki?n nh? b?nh Parkinson, huy?t p cao (t?ng xng), ti?u ???ng v cc v?n ?? v? tuy?n gip.  Ti?p xc v?i nhi?t ?? qu m?c. CH?N ?ON  ?? tm nguyn nhn gy hoa m?t, chuyn gia ch?m [place] y t? c th? khm tr?c ti?p, xt nghi?m, ch?p X-quang ho?c xt nghi?m ghi c? ?i?n (ECG).  ?I?U TR?  ?i?u tr? hoa m?t ph? thu?c vo nguyn nhn c?a tri?u ch?ng v c th? khc nhau r?t nhi?u.  H??NG D?N CH?M [place] T?I NH   U?ng ?? n??c ?? gi? cho n??c ti?u trong ho?c vng nh?t. ?i?u ny ??c bi?t quan tr?ng trong th?i ti?t r?t nng. ? ng??i gi, n c?ng l quan tr?ng trong th?i ti?t l?nh.  N?u b?n b? hoa m?t do thu?c, hy s? d?ng chng chnh xc theo ch? d?n. Khi s? d?ng thu?c tr? huy?t p, ?i?u ??c bi?t quan tr?ng l ??ng ln t? t?.  ??ng ln d?n t? gh? v t? ?n ??nh cho ??n khi b?n c?m th?y khng c v?n ?? g.  Vo bu?i sng, ??u tin ng?i d?y bn c?nh gi??ng. Khi b?n c?m th?y ?n ? t? th? ny, hy ??ng ch?m d?y trong khi n?m l?y m?t ci g ? cho ??n khi b?n th?y cn b?ng t?t.  N?u b?n c?n ??ng ? m?t n?i trong m?t th?i gian di, hy nh? di chuy?n chn th??ng xuyn. Th?t ch?t v th? gin cc c? b?p ? chn b?n khi ??ng.  N?u hoa m?t ti?p t?c l m?t v?n ??, b?n c?n ? cng v?i ai ? trong m?t ho?c hai ngy. Th?c hi?n ?i?u ny cho ??n khi b?n c?m th?y mnh ?? kh?e ?? ? m?t mnh. Yu c?u ng??i ny g?i cho chuyn gia ch?m [place] y t? c?a b?n n?u h? nh?n nh?ng thay ??i trong b?n l v?n ?? c?n quan tm.  Khng [place] xe ho?c v?n hnh my mc n?ng n?u b?n c?m th?y chng m?t.  Khng u?ng r??u. HY NGAY L?P T?C THAM V?N V?I CHUYN GIA Y T? N?U:   B?n b? hoa m?t ho?c chng m?t n?ng h?n.  B?n c?m th?y bu?n nn ho?c nn.  B?n c v?n ?? v? ni chuy?n, ?i b?, ?m y?u ho?c  vi?c s? d?ng cnh tay, bn tay hay chn.  B?n suy ngh? khng r rng ho?c b?n g?p kh kh?n khi t?o cu. C th? c?n nh? m?t ng??i b?n ho?c thnh vin gia ?nh xc ??nh xem suy ngh? c?a b?n c bnh th??ng khng.  B?n b? ?au ng?c, ?  au b?ng, th? d?c ho?c v m? hi.  Th? l?c c?a b?n thay ??i.  B?n nh?n th?y b?t k? s? ch?y mu no.  B?n c tc d?ng ph? t? thu?c c v? nh? tr? nn t?i t? h?n ch? khng ph?i l t?t h?n. ??M B?O B?N:   Hi?u cc h??ng d?n ny.  S? theo di tnh tr?ng c?a mnh.  S? yu c?u tr? gip ngay l?p t?c n?u b?n c?m th?y khng kh?e ho?c tnh tr?ng tr? nn t?i h?n. Document Released: 07/16/2011 Document Revised: 10/19/2011 [facility] Patient Information 2013 [place], [place].

## 2012-07-27 NOTE — Telephone Encounter (Signed)
Patient wants to speak to Dr. Gwenlyn Saran about her symptoms that she is still having.

## 2012-07-27 NOTE — Progress Notes (Signed)
Patient ID: Christianne Borrow    DOB: [birth year], 46 y.o.   MRN: [id number] --- Subjective:  Christle is a 46 y.o.female who presents with dizziness x 2weeks.  Feels like she is drunk, not like room is spinning. This occurs every 2-3 days, occurs when she looks up or down, not so much when she stands up. No change in vision, no ringing in the ears, no headache. No weakness. Lasts 3-4 minutes at most and goes away on its own or if she changes head position. She states that she has only felt like this when she was anemic.   ROS: see HPI Past Medical History: reviewed and updated medications and allergies. Social History: Tobacco: denies  Objective: Filed Vitals:   07/27/12 0931  BP: 122/83  Pulse: 68  Temp: 98.4 F (36.9 C)    Physical Examination:   General appearance - alert, well appearing, and in no distress Ears - bilateral TM's and external ear canals normal, right TM slightly duller than left, but no fluid level  Nose - normal and patent, mildly congested Mouth - mucous membranes moist, pharynx normal without lesions Neck - supple, no significant adenopathy Chest - clear to auscultation, no wheezes, rales or rhonchi, symmetric air entry Heart - normal rate, regular rhythm, normal S1, S2, no murmurs, rubs, clicks or gallops Neuro - CN 2-12 grossly intact, 5/5 strength in upper and lower extremities bilaterally. No nystagmus appreciated Dix- Hallpike: negative

## 2012-12-02 ENCOUNTER — Encounter: Payer: Self-pay | Admitting: Family Medicine

## 2012-12-02 ENCOUNTER — Ambulatory Visit (INDEPENDENT_AMBULATORY_CARE_PROVIDER_SITE_OTHER): Payer: No Typology Code available for payment source | Admitting: Family Medicine

## 2012-12-02 VITALS — BP 116/72 | HR 72 | Ht 60.0 in | Wt 150.2 lb

## 2012-12-02 DIAGNOSIS — N938 Other specified abnormal uterine and vaginal bleeding: Secondary | ICD-10-CM

## 2012-12-02 DIAGNOSIS — D649 Anemia, unspecified: Secondary | ICD-10-CM

## 2012-12-02 DIAGNOSIS — D509 Iron deficiency anemia, unspecified: Secondary | ICD-10-CM

## 2012-12-02 DIAGNOSIS — N949 Unspecified condition associated with female genital organs and menstrual cycle: Secondary | ICD-10-CM

## 2012-12-02 DIAGNOSIS — R42 Dizziness and giddiness: Secondary | ICD-10-CM

## 2012-12-02 DIAGNOSIS — E669 Obesity, unspecified: Secondary | ICD-10-CM

## 2012-12-02 DIAGNOSIS — R635 Abnormal weight gain: Secondary | ICD-10-CM

## 2012-12-02 LAB — CBC
HCT: 41 % (ref 36.0–46.0)
Hemoglobin: 14.1 g/dL (ref 12.0–15.0)
MCH: 29.3 pg (ref 26.0–34.0)
MCHC: 34.4 g/dL (ref 30.0–36.0)
MCV: 85.1 fL (ref 78.0–100.0)
RBC: 4.82 MIL/uL (ref 3.87–5.11)

## 2012-12-02 MED ORDER — FERROUS SULFATE 325 (65 FE) MG PO TABS: 325.0000 mg | ORAL_TABLET | Freq: Three times a day (TID) | ORAL | Status: DC

## 2012-12-02 NOTE — Assessment & Plan Note (Signed)
Resolved

## 2012-12-02 NOTE — Progress Notes (Signed)
Patient ID: Christianne Borrow    DOB: [birth year], 47 y.o.   MRN: [id number] --- Subjective:  Almina is a 47 y.o.female who presents for follow up on anemia and for question about weight.   - anemia: periods have been irregular, every 2 months, light, 2 days in duration. She used to have regular periods except for a period of time where periods were heavy and hemoglobin dropped. She has been taking ferrous sulfate three times a day and denies any shortness of breath, any fatigue.   - weight gain: patient concerned about weight gain and asking if she can be started on phentermine. She reports that she uses the elliptical 10-59minutes every day. She reports that she doesn't eat very much. Doesn't eat breakfast. Eats around 2pm. Eats noddles, rice and vegetables. Drinks water and tea with sugar in it. Sometimes drinks soda.  She denies any fatigue or change in skin texture or changes in temperature.    ROS: see HPI Past Medical History: reviewed and updated medications and allergies. Social History: Tobacco: none  Objective: Filed Vitals:   12/02/12 0911  BP: 116/72  Pulse: 72    Physical Examination:   General appearance - alert, well appearing, and in no distress Neck - supple, no significant adenopathy Chest - clear to auscultation, no wheezes, rales or rhonchi, symmetric air entry Heart - normal rate, regular rhythm, normal S1, S2, no murmurs Extremities - no edema

## 2012-12-02 NOTE — Assessment & Plan Note (Signed)
From heavy vaginal bleeding which has since resolved. Check CBC today and if normal, will stop ferrous sulfate.

## 2012-12-02 NOTE — Assessment & Plan Note (Signed)
Told patient I do not prescribe diet pills given potential long term side effects of pulmonary hypertension and valvular disorders.  Encouraged exercise and diet. Recommended to decrease the amount of carbohydrates and eating protein and vegetables. Also recommending stopping sweet drinks. Will check TSH to make sure there isn't a hypothyroid component contributing to weight gain (10 lbs in 1 year).

## 2012-12-02 NOTE — Patient Instructions (Signed)
For the knee pain, you can take 2 tablets of ibuprofen every 8 hours as needed for the pain. Do not take more than 6 tablets in a day.   For the weight, continue exercising and increase the vegetables and fish you eat and decrease the amount of carbohydrates like noodles and rice. Also, try to avoid sugar in your drinks.   I am checking your blood count to see if you need to continue taking the iron. We will also check your thyroid to make sure it is not causing the weight gain.   For the multivitamin, you can take 1 a day women's multivitamin.

## 2012-12-06 ENCOUNTER — Telehealth: Payer: Self-pay | Admitting: Family Medicine

## 2012-12-06 NOTE — Telephone Encounter (Signed)
Called patient and I let her know that her CBC and TSH were normal. Recommended that patient stop taking the iron supplements since her Hg is now 14 and she is no longer having severe vaginal bleeding. Patient expressed understanding.   Marena Chancy, PGY-2 Family Medicine Resident

## 2012-12-26 ENCOUNTER — Ambulatory Visit (INDEPENDENT_AMBULATORY_CARE_PROVIDER_SITE_OTHER): Payer: No Typology Code available for payment source | Admitting: Emergency Medicine

## 2012-12-26 ENCOUNTER — Encounter: Payer: Self-pay | Admitting: Emergency Medicine

## 2012-12-26 VITALS — BP 132/84 | HR 75 | Temp 99.8°F | Ht 60.0 in | Wt 146.3 lb

## 2012-12-26 DIAGNOSIS — N393 Stress incontinence (female) (male): Secondary | ICD-10-CM

## 2012-12-26 DIAGNOSIS — R059 Cough, unspecified: Secondary | ICD-10-CM

## 2012-12-26 DIAGNOSIS — R058 Other specified cough: Secondary | ICD-10-CM | POA: Insufficient documentation

## 2012-12-26 DIAGNOSIS — R05 Cough: Secondary | ICD-10-CM | POA: Insufficient documentation

## 2012-12-26 MED ORDER — BENZONATATE 100 MG PO CAPS: 100.0000 mg | ORAL_CAPSULE | Freq: Two times a day (BID) | ORAL | Status: DC | PRN

## 2012-12-26 NOTE — Assessment & Plan Note (Signed)
History and exam consistent with this.  No fevers or focal lung finding.  Will given Tessalon pearls.  Discussed that this will improve over the next weeks to month or so.  If Tessalon pearls do not help, she will call and I will send in hycodan syrup.

## 2012-12-26 NOTE — Patient Instructions (Signed)
It was nice to see you! 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 Take tessalon pearls as needed for cough. The cough will get better over the next few weeks to months. Do Kegel exercises 2-3 times a day. If you are still having trouble with your bladder in 2 weeks, please come back.

## 2012-12-26 NOTE — Assessment & Plan Note (Signed)
Kegel hand out given.  Follow up if no improvement in the next 2-3 weeks.  May need to consider an anti-spasmodic or referral.

## 2012-12-26 NOTE — Progress Notes (Signed)
  Subjective:    Patient ID: Theresa Diaz, female    DOB: 04/16/1966, 47 y.o.   MRN: [id number]  HPI Theresa Diaz is here for a SDA for cough.  She reports cold symptoms with fever about 2 weeks ago.  Everything has gotten better, except for the cough.  It is worse at night.  Has tried multiple OTCs without relief.  Also reports leaking urine with cough, lifting heavy things, and running.  A relative had the same problem and her doctor gave her 4 pills which fixed the problem, but she does not know the name of the medicine.  I have reviewed and updated the following as appropriate: allergies and current medications SHx: non smoker  Review of Systems See HPI    Objective:   Physical Exam BP 132/84  Pulse 75  Temp(Src) 99.8 F (37.7 C) (Oral)  Ht 5' (1.524 m)  Wt 146 lb 4.8 oz (66.361 kg)  BMI 28.57 kg/m2  LMP 12/04/2012 Gen: alert, cooperative, NAD HEENT: AT/[place], sclera white, MMM Neck: supple CV: RRR, no murmurs Pulm: CTAB, no wheezes or rales     Assessment & Plan:

## 2013-01-09 ENCOUNTER — Other Ambulatory Visit: Payer: Self-pay | Admitting: Emergency Medicine

## 2013-01-09 DIAGNOSIS — Z1231 Encounter for screening mammogram for malignant neoplasm of breast: Secondary | ICD-10-CM

## 2013-02-03 ENCOUNTER — Ambulatory Visit (HOSPITAL_COMMUNITY)
Admission: RE | Admit: 2013-02-03 | Discharge: 2013-02-03 | Disposition: A | Discharge status: Home / Self Care | Payer: No Typology Code available for payment source | Source: Ambulatory Visit | Attending: Family Medicine | Admitting: Family Medicine

## 2013-02-03 DIAGNOSIS — Z1231 Encounter for screening mammogram for malignant neoplasm of breast: Secondary | ICD-10-CM | POA: Insufficient documentation

## 2013-04-04 ENCOUNTER — Encounter: Payer: Self-pay | Admitting: Family Medicine

## 2013-04-04 ENCOUNTER — Ambulatory Visit (INDEPENDENT_AMBULATORY_CARE_PROVIDER_SITE_OTHER): Payer: No Typology Code available for payment source | Admitting: Family Medicine

## 2013-04-04 VITALS — BP 119/80 | HR 65 | Temp 98.2°F | Wt 144.0 lb

## 2013-04-04 DIAGNOSIS — Z0000 Encounter for general adult medical examination without abnormal findings: Secondary | ICD-10-CM

## 2013-04-04 DIAGNOSIS — Z780 Asymptomatic menopausal state: Secondary | ICD-10-CM

## 2013-04-04 DIAGNOSIS — N951 Menopausal and female climacteric states: Secondary | ICD-10-CM

## 2013-04-04 LAB — POCT URINE PREGNANCY: Preg Test, Ur: NEGATIVE

## 2013-04-04 NOTE — Assessment & Plan Note (Signed)
Last PAP normal last year. Repeat PAP in 1-2 years with HPV cotest.  Mammogram normal from June. Repeat in 1 year.  Flu shot in fall Lipid panel and CMP: patient to return while fasting.

## 2013-04-04 NOTE — Assessment & Plan Note (Signed)
Given symptoms of hot flashes and no periods, likely menopausal. UPT negative.  Will check vitamin D level with future labs. No treatment for hot flashes at this time.

## 2013-04-04 NOTE — Patient Instructions (Signed)
For the lab tests, make an appointment for a lab appointment in the morning. Come fasting: no food or drink other than water after midnight.   I'll call you if the results are abnormal.

## 2013-04-04 NOTE — Progress Notes (Signed)
Patient ID: Theresa Diaz    DOB: November 17, 1965, 47 y.o.   MRN: [id number] --- Subjective:  Theresa Diaz is a 47 y.o.female [id number] who presents for yearly physical.  She doesn't have any specific complaints.  - Gyn: LMP: in April 2014. Has had associated hot flashes that start all of a sudden and resolve on their own. Not debilitating. Denies any vaginal discharge, burning or itching. Occasionally sexually active with her husband. No concern for STD.  Has never had an abnormal PAP. Last PAP: 02/15/2012:  Report:  Satisfactory for evaluation, endocervical/transformation zone component PRESENT. NEGATIVE FOR INTRAEPITHELIAL LESIONS OR MALIGNANCY. Last mammogram: 02/03/2013: normal   ROS:  Review of Systems - General ROS: negative for - chills or fatigue, positive for hot flashes Breast ROS: negative for breast lumps Respiratory ROS: no cough, shortness of breath, or wheezing Cardiovascular ROS: no chest pain or dyspnea on exertion Gastrointestinal ROS: no abdominal pain, change in bowel habits, or black or bloody stools Genito-Urinary ROS: no dysuria, trouble voiding, or hematuria Musculoskeletal ROS: positive for - occasional right knee pain that goes away with rest Neurological ROS: negative for - dizziness, headaches, visual changes or weakness  Past Medical History: reviewed and updated medications and allergies. Social History: Tobacco: none  Objective: Filed Vitals:   04/04/13 0907  BP: 119/80  Pulse: 65  Temp: 98.2 F (36.8 C)    Physical Examination:   General appearance - alert, well appearing, and in no distress Nose - normal and patent, no erythema, discharge or polyps Mouth - mucous membranes moist, pharynx normal without lesions Neck - supple, no significant adenopathy, normal thyroid Chest - clear to auscultation, no wheezes, rales or rhonchi, symmetric air entry Heart - normal rate, regular rhythm, normal S1, S2, no murmurs, rubs, clicks or gallops Abdomen - soft, nontender,  nondistended, no masses or organomegaly Extremities - peripheral pulses normal, no pedal edema

## 2014-03-23 ENCOUNTER — Other Ambulatory Visit (HOSPITAL_COMMUNITY): Payer: Self-pay | Admitting: Emergency Medicine

## 2014-03-23 DIAGNOSIS — Z1231 Encounter for screening mammogram for malignant neoplasm of breast: Secondary | ICD-10-CM

## 2014-03-27 ENCOUNTER — Ambulatory Visit (INDEPENDENT_AMBULATORY_CARE_PROVIDER_SITE_OTHER): Payer: No Typology Code available for payment source | Admitting: Family Medicine

## 2014-03-27 ENCOUNTER — Encounter: Payer: Self-pay | Admitting: Family Medicine

## 2014-03-27 VITALS — BP 118/84 | HR 75 | Temp 98.2°F | Ht 60.0 in | Wt 144.0 lb

## 2014-03-27 DIAGNOSIS — Z0000 Encounter for general adult medical examination without abnormal findings: Secondary | ICD-10-CM

## 2014-03-27 DIAGNOSIS — N951 Menopausal and female climacteric states: Secondary | ICD-10-CM

## 2014-03-27 DIAGNOSIS — Z780 Asymptomatic menopausal state: Secondary | ICD-10-CM

## 2014-03-27 LAB — LIPID PANEL
CHOL/HDL RATIO: 4.3 ratio
Cholesterol: 203 mg/dL — ABNORMAL HIGH (ref 0–200)
HDL: 47 mg/dL (ref >39)
LDL CALC: 129 mg/dL — AB (ref 0–99)
Triglycerides: 135 mg/dL (ref <150)
VLDL: 27 mg/dL (ref 0–40)

## 2014-03-27 LAB — BASIC METABOLIC PANEL
BUN: 10 mg/dL (ref 6–23)
CO2: 27 mEq/L (ref 19–32)
Calcium: 8.8 mg/dL (ref 8.4–10.5)
Chloride: 104 mEq/L (ref 96–112)
Creat: 0.59 mg/dL (ref 0.50–1.10)
Glucose, Bld: 102 mg/dL — ABNORMAL HIGH (ref 70–99)
POTASSIUM: 3.9 meq/L (ref 3.5–5.3)
SODIUM: 138 meq/L (ref 135–145)

## 2014-03-27 NOTE — Assessment & Plan Note (Signed)
Last pap normal in 2013, repeat next year Mammogram last in 2014, scheduled next this September Flu shot not yet available Lipid panel and BMP today

## 2014-03-27 NOTE — Patient Instructions (Signed)

## 2014-03-27 NOTE — Progress Notes (Signed)
   Subjective:    Patient ID: Theresa BorrowNancy Che Diaz, female    DOB: 11/03/1965, 48 y.o.   MRN: [id number]  HPI Pt presents for well woman exam. Only current health concern is irregular periods, thinks it may be related to menopause. In April and May she had 2 week long heavy periods and then did not have one until August. She is asymptomatic other than this. No mood disturbances, headaches, hot flashes.   UTD on health maintenance. Mammograms annually, next due and scheduled in September. Last pap in 2013 normal.   Review of Systems See HPI    Objective:   Physical Exam  Nursing note and vitals reviewed. Constitutional: She is oriented to person, place, and time. She appears well-developed and well-nourished. No distress.  HENT:  Head: Normocephalic and atraumatic.  Mouth/Throat: Oropharynx is clear and moist. No oropharyngeal exudate.  Eyes: Conjunctivae are normal. Pupils are equal, round, and reactive to light. Right eye exhibits no discharge. Left eye exhibits no discharge. No scleral icterus.  Cardiovascular: Normal rate, regular rhythm, normal heart sounds and intact distal pulses.   No murmur heard. Pulmonary/Chest: Effort normal and breath sounds normal. No respiratory distress. She has no wheezes. She has no rales.  Abdominal: Soft. Bowel sounds are normal. She exhibits no distension and no mass. There is no tenderness. There is no rebound and no guarding.  Neurological: She is alert and oriented to person, place, and time.  Skin: Skin is warm and dry. No rash noted. She is not diaphoretic. No erythema. No pallor.  Psychiatric: She has a normal mood and affect. Her behavior is normal.          Assessment & Plan:

## 2014-03-27 NOTE — Assessment & Plan Note (Addendum)
Irregular periods, no symptoms at this time - warned that may still be fertile, likely periods will continue to be irregular until stopping entirely

## 2014-04-17 ENCOUNTER — Ambulatory Visit (HOSPITAL_COMMUNITY)
Admission: RE | Admit: 2014-04-17 | Discharge: 2014-04-17 | Disposition: A | Discharge status: Home / Self Care | Payer: No Typology Code available for payment source | Source: Ambulatory Visit | Attending: Emergency Medicine | Admitting: Emergency Medicine

## 2014-04-17 DIAGNOSIS — Z1231 Encounter for screening mammogram for malignant neoplasm of breast: Secondary | ICD-10-CM | POA: Diagnosis not present

## 2014-10-09 ENCOUNTER — Encounter: Payer: Self-pay | Admitting: Family Medicine

## 2014-10-09 ENCOUNTER — Ambulatory Visit (INDEPENDENT_AMBULATORY_CARE_PROVIDER_SITE_OTHER): Payer: No Typology Code available for payment source | Admitting: Family Medicine

## 2014-10-09 VITALS — BP 109/72 | HR 75 | Temp 98.2°F | Ht 60.0 in | Wt 145.5 lb

## 2014-10-09 DIAGNOSIS — K649 Unspecified hemorrhoids: Secondary | ICD-10-CM | POA: Insufficient documentation

## 2014-10-09 DIAGNOSIS — K2970 Gastritis, unspecified, without bleeding: Secondary | ICD-10-CM | POA: Insufficient documentation

## 2014-10-09 DIAGNOSIS — K2990 Gastroduodenitis, unspecified, without bleeding: Secondary | ICD-10-CM

## 2014-10-09 DIAGNOSIS — K8020 Calculus of gallbladder without cholecystitis without obstruction: Secondary | ICD-10-CM

## 2014-10-09 HISTORY — DX: Calculus of gallbladder without cholecystitis without obstruction: K80.20

## 2014-10-09 LAB — COMPREHENSIVE METABOLIC PANEL
ALBUMIN: 4.5 g/dL (ref 3.5–5.2)
ALK PHOS: 65 U/L (ref 39–117)
ALT: 39 U/L — ABNORMAL HIGH (ref 0–35)
AST: 24 U/L (ref 0–37)
BUN: 6 mg/dL (ref 6–23)
CALCIUM: 9.2 mg/dL (ref 8.4–10.5)
CHLORIDE: 101 meq/L (ref 96–112)
CO2: 29 mEq/L (ref 19–32)
Creat: 0.52 mg/dL (ref 0.50–1.10)
Glucose, Bld: 127 mg/dL — ABNORMAL HIGH (ref 70–99)
Potassium: 3.5 mEq/L (ref 3.5–5.3)
SODIUM: 137 meq/L (ref 135–145)
TOTAL PROTEIN: 7.6 g/dL (ref 6.0–8.3)
Total Bilirubin: 0.3 mg/dL (ref 0.2–1.2)

## 2014-10-09 LAB — LIPASE: LIPASE: 25 U/L (ref 0–75)

## 2014-10-09 MED ORDER — ONDANSETRON HCL 4 MG PO TABS: 4.0000 mg | ORAL_TABLET | Freq: Three times a day (TID) | ORAL | Status: DC | PRN

## 2014-10-09 MED ORDER — HYDROCORTISONE 2.5 % RE CREA: 1.0000 "application " | TOPICAL_CREAM | Freq: Two times a day (BID) | RECTAL | Status: DC

## 2014-10-09 NOTE — Assessment & Plan Note (Signed)
Persistent nausea and upset stomach after eating an apple 2 days ago Consider this gastritis with a reassuring abdominal exam We will check routine labs including lipase Follow-up if not improving, discussed red flags for return including not tolerating fluids, fevers, or worsening abdominal pain.

## 2014-10-09 NOTE — Patient Instructions (Addendum)
Great to see you!  It sounds like you have an upset stomach that could be due to the Apple you ate. Try Zofran for nausea, please be sure to stay hydrated with plenty of fluids.  It also sounds like you have hemorrhoids. Try Metamucil once daily to help make it easier to have stools. I also sent a medicine called hydrocortisone cream for your pain.  Gastritis, Adult Gastritis is soreness and swelling (inflammation) of the lining of the stomach. Gastritis can develop as a sudden onset (acute) or long-term (chronic) condition. If gastritis is not treated, it can lead to stomach bleeding and ulcers. CAUSES  Gastritis occurs when the stomach lining is weak or damaged. Digestive juices from the stomach then inflame the weakened stomach lining. The stomach lining may be weak or damaged due to viral or bacterial infections. One common bacterial infection is the Helicobacter pylori infection. Gastritis can also result from excessive alcohol consumption, taking certain medicines, or having too much acid in the stomach.  SYMPTOMS  In some cases, there are no symptoms. When symptoms are present, they may include:  Pain or a burning sensation in the upper abdomen.  Nausea.  Vomiting.  An uncomfortable feeling of fullness after eating. DIAGNOSIS  Your caregiver may suspect you have gastritis based on your symptoms and a physical exam. To determine the cause of your gastritis, your caregiver may perform the following:  Blood or stool tests to check for the H pylori bacterium.  Gastroscopy. A thin, flexible tube (endoscope) is passed down the esophagus and into the stomach. The endoscope has a light and camera on the end. Your caregiver uses the endoscope to view the inside of the stomach.  Taking a tissue sample (biopsy) from the stomach to examine under a microscope. TREATMENT  Depending on the cause of your gastritis, medicines may be prescribed. If you have a bacterial infection, such as an H  pylori infection, antibiotics may be given. If your gastritis is caused by too much acid in the stomach, H2 blockers or antacids may be given. Your caregiver may recommend that you stop taking aspirin, ibuprofen, or other nonsteroidal anti-inflammatory drugs (NSAIDs). HOME CARE INSTRUCTIONS  Only take over-the-counter or prescription medicines as directed by your caregiver.  If you were given antibiotic medicines, take them as directed. Finish them even if you start to feel better.  Drink enough fluids to keep your urine clear or pale yellow.  Avoid foods and drinks that make your symptoms worse, such as:  Caffeine or alcoholic drinks.  Chocolate.  Peppermint or mint flavorings.  Garlic and onions.  Spicy foods.  Citrus fruits, such as oranges, lemons, or limes.  Tomato-based foods such as sauce, chili, salsa, and pizza.  Fried and fatty foods.  Eat small, frequent meals instead of large meals. SEEK IMMEDIATE MEDICAL CARE IF:   You have black or dark red stools.  You vomit blood or material that looks like coffee grounds.  You are unable to keep fluids down.  Your abdominal pain gets worse.  You have a fever.  You do not feel better after 1 week.  You have any other questions or concerns. MAKE SURE YOU:  Understand these instructions.  Will watch your condition.  Will get help right away if you are not doing well or get worse. Document Released: 07/21/2001 Document Revised: 01/26/2012 Document Reviewed: 09/09/2011 [facility] Patient Information 2015 [place], [place]. This information is not intended to replace advice given to you by your health care provider.  Make sure you discuss any questions you have with your health care provider.  

## 2014-10-09 NOTE — Progress Notes (Signed)
Patient ID: Theresa BorrowNancy Che Jeffreys, female   DOB: 09/13/1965, 49 y.o.   MRN: [id number]   HPI  Patient presents today for abdominal pain and hemorrhoids.  Abdominal pain Patient describes mild abdominal pain with predominantly upset stomach for the last 2 days after eating an apple on Sunday. She states that her stomach feels like it's rolling intermittently and that she has waves of nausea. She's had 2 episodes of vomiting containing only stomach contents, no blood or bile. She states that she has intermittent sharp momentary epigastric pain followed by nausea. She's been able tolerate food 2 days ago which then caused her to throw up. She has tolerated fluids easily since that time.  Hemorrhoids  she has a history of hemorrhoids during pregnancy, she describes large prolapsing hemorrhoids at that time. She states that for the last 2-3 days she's had small prolapse with some pain whenever she stools. She normally stools 2-3 times daily but has to linger in the toilet for a long time. She has not tried medications for this.  Smoking status noted ROS: Per HPI  Objective: BP 109/72 mmHg  Pulse 75  Temp(Src) 98.2 F (36.8 C) (Oral)  Ht 5' (1.524 m)  Wt 145 lb 8 oz (65.998 kg)  BMI 28.42 kg/m2 Gen: NAD, alert, cooperative with exam HEENT: NCAT CV: RRR, good S1/S2, no murmur Resp: CTABL, no wheezes, non-labored Abd: Soft, slight tenderness to palpation in epigastric area, no rebound or masses felt, no Murphy sign Ext: No edema, warm Neuro: Alert and oriented, No gross deficits  Assessment and plan:  Gastritis and gastroduodenitis Persistent nausea and upset stomach after eating an apple 2 days ago Consider this gastritis with a reassuring abdominal exam We will check routine labs including lipase Follow-up if not improving, discussed red flags for return including not tolerating fluids, fevers, or worsening abdominal pain.   Hemorrhoids History consistent with hemorrhoids Try Anusol  cream     Orders Placed This Encounter  Procedures  . Comprehensive metabolic panel  . CBC with Differential  . Lipase    Meds ordered this encounter  Medications  . ondansetron (ZOFRAN) 4 MG tablet    Sig: Take 1 tablet (4 mg total) by mouth every 8 (eight) hours as needed for nausea or vomiting.    Dispense:  20 tablet    Refill:  0

## 2014-10-09 NOTE — Assessment & Plan Note (Signed)
History consistent with hemorrhoids Try Anusol cream

## 2014-10-10 ENCOUNTER — Telehealth: Payer: Self-pay | Admitting: Family Medicine

## 2014-10-10 DIAGNOSIS — K2990 Gastroduodenitis, unspecified, without bleeding: Principal | ICD-10-CM

## 2014-10-10 DIAGNOSIS — K2970 Gastritis, unspecified, without bleeding: Secondary | ICD-10-CM

## 2014-10-10 LAB — CBC WITH DIFFERENTIAL/PLATELET
Basophils Absolute: 0 10*3/uL (ref 0.0–0.1)
Basophils Relative: 0 % (ref 0–1)
Eosinophils Absolute: 0.1 10*3/uL (ref 0.0–0.7)
Eosinophils Relative: 1 % (ref 0–5)
HEMATOCRIT: 42.2 % (ref 36.0–46.0)
Hemoglobin: 13.7 g/dL (ref 12.0–15.0)
LYMPHS ABS: 2.5 10*3/uL (ref 0.7–4.0)
LYMPHS PCT: 36 % (ref 12–46)
MCH: 26.3 pg (ref 26.0–34.0)
MCHC: 32.5 g/dL (ref 30.0–36.0)
MCV: 81 fL (ref 78.0–100.0)
MONO ABS: 0.4 10*3/uL (ref 0.1–1.0)
MONOS PCT: 5 % (ref 3–12)
MPV: 8.8 fL (ref 8.6–12.4)
NEUTROS ABS: 4.1 10*3/uL (ref 1.7–7.7)
NEUTROS PCT: 58 % (ref 43–77)
Platelets: 383 10*3/uL (ref 150–400)
RBC: 5.21 MIL/uL — AB (ref 3.87–5.11)
RDW: 17.2 % — ABNORMAL HIGH (ref 11.5–15.5)
WBC: 7 10*3/uL (ref 4.0–10.5)

## 2014-10-10 NOTE — Telephone Encounter (Signed)
Called to discuss normal labs.   Pt had epsiode of green vomit that is concerning for biolious emesis overnight. She better now and states that she is tolerating fluids. I offered, and ordered, a plain film of the abdomen and asked her to go get it done now. She would liek to wait as she feels better, I gave her red flags for return and asked her to go get the x ray if she gets worse or if the green vomiting continues.   Murtis SinkSam Kalep Full, MD [facility] Resident, PGY-3 10/10/2014, 9:40 AM

## 2014-10-11 ENCOUNTER — Emergency Department (HOSPITAL_COMMUNITY)
Admission: EM | Admit: 2014-10-11 | Discharge: 2014-10-11 | Disposition: A | Discharge status: Home / Self Care | Payer: No Typology Code available for payment source | Attending: Emergency Medicine | Admitting: Emergency Medicine

## 2014-10-11 ENCOUNTER — Telehealth: Payer: Self-pay | Admitting: Family Medicine

## 2014-10-11 ENCOUNTER — Other Ambulatory Visit: Payer: Self-pay

## 2014-10-11 ENCOUNTER — Ambulatory Visit
Admission: RE | Admit: 2014-10-11 | Discharge: 2014-10-11 | Disposition: A | Discharge status: Home / Self Care | Payer: No Typology Code available for payment source | Source: Ambulatory Visit | Attending: Family Medicine | Admitting: Family Medicine

## 2014-10-11 ENCOUNTER — Encounter (HOSPITAL_COMMUNITY): Payer: Self-pay | Admitting: Emergency Medicine

## 2014-10-11 ENCOUNTER — Emergency Department (HOSPITAL_COMMUNITY): Payer: No Typology Code available for payment source

## 2014-10-11 DIAGNOSIS — K2970 Gastritis, unspecified, without bleeding: Secondary | ICD-10-CM

## 2014-10-11 DIAGNOSIS — R1011 Right upper quadrant pain: Secondary | ICD-10-CM

## 2014-10-11 DIAGNOSIS — Z79899 Other long term (current) drug therapy: Secondary | ICD-10-CM | POA: Diagnosis not present

## 2014-10-11 DIAGNOSIS — R1013 Epigastric pain: Secondary | ICD-10-CM | POA: Diagnosis present

## 2014-10-11 DIAGNOSIS — K8020 Calculus of gallbladder without cholecystitis without obstruction: Secondary | ICD-10-CM | POA: Diagnosis not present

## 2014-10-11 DIAGNOSIS — Z9889 Other specified postprocedural states: Secondary | ICD-10-CM | POA: Insufficient documentation

## 2014-10-11 DIAGNOSIS — K2990 Gastroduodenitis, unspecified, without bleeding: Principal | ICD-10-CM

## 2014-10-11 DIAGNOSIS — Z7952 Long term (current) use of systemic steroids: Secondary | ICD-10-CM | POA: Diagnosis not present

## 2014-10-11 DIAGNOSIS — R112 Nausea with vomiting, unspecified: Secondary | ICD-10-CM

## 2014-10-11 LAB — CBC WITH DIFFERENTIAL/PLATELET
BASOS PCT: 0 % (ref 0–1)
Basophils Absolute: 0 10*3/uL (ref 0.0–0.1)
EOS ABS: 0.1 10*3/uL (ref 0.0–0.7)
Eosinophils Relative: 1 % (ref 0–5)
HCT: 45 % (ref 36.0–46.0)
Hemoglobin: 15.1 g/dL — ABNORMAL HIGH (ref 12.0–15.0)
LYMPHS PCT: 26 % (ref 12–46)
Lymphs Abs: 2.5 10*3/uL (ref 0.7–4.0)
MCH: 26.8 pg (ref 26.0–34.0)
MCHC: 33.6 g/dL (ref 30.0–36.0)
MCV: 79.8 fL (ref 78.0–100.0)
Monocytes Absolute: 0.4 10*3/uL (ref 0.1–1.0)
Monocytes Relative: 4 % (ref 3–12)
NEUTROS ABS: 6.6 10*3/uL (ref 1.7–7.7)
NEUTROS PCT: 69 % (ref 43–77)
PLATELETS: 386 10*3/uL (ref 150–400)
RBC: 5.64 MIL/uL — AB (ref 3.87–5.11)
RDW: 16.5 % — ABNORMAL HIGH (ref 11.5–15.5)
WBC: 9.5 10*3/uL (ref 4.0–10.5)

## 2014-10-11 LAB — TROPONIN I

## 2014-10-11 LAB — COMPREHENSIVE METABOLIC PANEL
ALT: 31 U/L (ref 0–35)
AST: 29 U/L (ref 0–37)
Albumin: 4.3 g/dL (ref 3.5–5.2)
Alkaline Phosphatase: 68 U/L (ref 39–117)
Anion gap: 11 (ref 5–15)
BUN: 9 mg/dL (ref 6–23)
CALCIUM: 9.1 mg/dL (ref 8.4–10.5)
CO2: 23 mmol/L (ref 19–32)
Chloride: 105 mmol/L (ref 96–112)
Creatinine, Ser: 0.59 mg/dL (ref 0.50–1.10)
GFR calc Af Amer: >90 mL/min (ref >90)
Glucose, Bld: 117 mg/dL — ABNORMAL HIGH (ref 70–99)
Potassium: 4.1 mmol/L (ref 3.5–5.1)
SODIUM: 139 mmol/L (ref 135–145)
Total Bilirubin: 0.6 mg/dL (ref 0.3–1.2)
Total Protein: 8.3 g/dL (ref 6.0–8.3)

## 2014-10-11 LAB — URINALYSIS, ROUTINE W REFLEX MICROSCOPIC
Bilirubin Urine: NEGATIVE
GLUCOSE, UA: NEGATIVE mg/dL
Hgb urine dipstick: NEGATIVE
Ketones, ur: NEGATIVE mg/dL
Leukocytes, UA: NEGATIVE
Nitrite: NEGATIVE
PH: 6.5 (ref 5.0–8.0)
Protein, ur: NEGATIVE mg/dL
SPECIFIC GRAVITY, URINE: 1.019 (ref 1.005–1.030)
Urobilinogen, UA: 0.2 mg/dL (ref 0.0–1.0)

## 2014-10-11 LAB — LIPASE, BLOOD: Lipase: 30 U/L (ref 11–59)

## 2014-10-11 MED ORDER — MORPHINE SULFATE 4 MG/ML IJ SOLN
4.0000 mg | Freq: Once | INTRAMUSCULAR | Status: AC
  Administered 2014-10-11: 4 mg via INTRAVENOUS
  Filled 2014-10-11: qty 1

## 2014-10-11 MED ORDER — ONDANSETRON 4 MG PO TBDP: ORAL_TABLET | ORAL | Status: DC

## 2014-10-11 MED ORDER — SODIUM CHLORIDE 0.9 % IV SOLN
1000.0000 mL | INTRAVENOUS | Status: DC
  Administered 2014-10-11: 1000 mL via INTRAVENOUS

## 2014-10-11 MED ORDER — ONDANSETRON HCL 4 MG/2ML IJ SOLN
4.0000 mg | Freq: Once | INTRAMUSCULAR | Status: AC
  Administered 2014-10-11: 4 mg via INTRAVENOUS
  Filled 2014-10-11: qty 2

## 2014-10-11 MED ORDER — HYDROCODONE-ACETAMINOPHEN 5-325 MG PO TABS
2.0000 | ORAL_TABLET | Freq: Once | ORAL | Status: AC
  Administered 2014-10-11: 2 via ORAL
  Filled 2014-10-11: qty 2

## 2014-10-11 MED ORDER — PROMETHAZINE HCL 12.5 MG PO TABS: 12.5000 mg | ORAL_TABLET | Freq: Three times a day (TID) | ORAL | Status: DC | PRN

## 2014-10-11 MED ORDER — HYDROCODONE-ACETAMINOPHEN 5-325 MG PO TABS: 1.0000 | ORAL_TABLET | Freq: Four times a day (QID) | ORAL | Status: DC | PRN

## 2014-10-11 NOTE — ED Provider Notes (Signed)
CSN: [id number]     Arrival date & time 10/11/14  1628 History   First MD Initiated Contact with Patient 10/11/14 1635     Chief Complaint  Patient presents with  . Abdominal Pain     (Consider location/radiation/quality/duration/timing/severity/associated sxs/prior Treatment) Patient is a 49 y.o. female presenting with abdominal pain. The history is provided by the patient and medical records. No language interpreter was used.  Abdominal Pain Associated symptoms: nausea and vomiting   Associated symptoms: no chest pain, no constipation, no cough, no diarrhea, no dysuria, no fatigue, no fever, no hematuria and no shortness of breath     Theresa Diaz is a 49 y.o. female  with a hx of cesarean section presents to the Emergency Department complaining of intermittent, waxing and waning epigastric abdominal pain onset 4 days ago. Patient reports that initially she had nausea and vomiting with the pain however this has resolved but the waxing and waning pain has continued. She reports that at times her pain resolves completely but recurs after she eats.  She reports that when the pain is present it is cramping in nature. She denies fever, chills, headache, neck pain, chest pain, shortness of breath, diarrhea, weakness, dizziness, syncope, dysuria, hematuria.  Patient reports no treatments prior to arrival.    History reviewed. No pertinent past medical history. Past Surgical History  Procedure Laterality Date  . Cesarean section  1999   Family History  Problem Relation Age of Onset  . Diabetes Father   . Hypertension Father    History  Substance Use Topics  . Smoking status: Never Smoker   . Smokeless tobacco: Never Used  . Alcohol Use: No   OB History    No data available     Review of Systems  Constitutional: Negative for fever, diaphoresis, appetite change, fatigue and unexpected weight change.  HENT: Negative for mouth sores and trouble swallowing.   Respiratory: Negative for  cough, chest tightness, shortness of breath, wheezing and stridor.   Cardiovascular: Negative for chest pain and palpitations.  Gastrointestinal: Positive for nausea, vomiting and abdominal pain. Negative for diarrhea, constipation, blood in stool, abdominal distention and rectal pain.  Genitourinary: Negative for dysuria, urgency, frequency, hematuria, flank pain and difficulty urinating.  Musculoskeletal: Negative for back pain, neck pain and neck stiffness.  Skin: Negative for rash.  Neurological: Negative for weakness.  Hematological: Negative for adenopathy.  Psychiatric/Behavioral: Negative for confusion.  All other systems reviewed and are negative.     Allergies  Review of patient's allergies indicates no known allergies.  Home Medications   Prior to Admission medications   Medication Sig Start Date End Date Taking? Authorizing Provider  fish oil-omega-3 fatty acids 1000 MG capsule Take 1 g by mouth daily.     Yes Historical Provider, MD  ibuprofen (ADVIL,MOTRIN) 200 MG tablet Take 200 mg by mouth every 6 (six) hours as needed for moderate pain (pain).   Yes Historical Provider, MD  meclizine (ANTIVERT) 12.5 MG tablet Take 1 tablet (12.5 mg total) by mouth 3 (three) times daily as needed for dizziness. 07/27/12  Yes Lonia SkinnerStephanie E Losq, MD  benzonatate (TESSALON) 100 MG capsule Take 1 capsule (100 mg total) by mouth 2 (two) times daily as needed for cough. Patient not taking: Reported on 10/11/2014 12/26/12   Charm RingsErin J Honig, MD  HYDROcodone-acetaminophen (NORCO/VICODIN) 5-325 MG per tablet Take 1-2 tablets by mouth every 6 (six) hours as needed for moderate pain or severe pain. 10/11/14   Dahlia ClientHannah Khaleel Beckom,  PA-C  hydrocortisone (ANUSOL-HC) 2.5 % rectal cream Place 1 application rectally 2 (two) times daily. 10/09/14   Elenora Gamma, MD  ondansetron (ZOFRAN ODT) 4 MG disintegrating tablet  ODT q4 hours prn nausea/vomit 10/11/14   Forrester Blando, PA-C  ondansetron (ZOFRAN) 4 MG  tablet Take 1 tablet (4 mg total) by mouth every 8 (eight) hours as needed for nausea or vomiting. 10/09/14   Elenora Gamma, MD  promethazine (PHENERGAN) 12.5 MG tablet Take 1 tablet (12.5 mg total) by mouth every 8 (eight) hours as needed for nausea or vomiting. 10/11/14   Elenora Gamma, MD   BP 124/73 mmHg  Pulse 70  Temp(Src) 97.8 F (36.6 C) (Oral)  Resp 18  SpO2 97%  LMP 04/12/2014 Physical Exam  Constitutional: She appears well-developed and well-nourished.  HENT:  Head: Normocephalic and atraumatic.  Mouth/Throat: Oropharynx is clear and moist.  Eyes: Conjunctivae are normal. No scleral icterus.  Cardiovascular: Normal rate, regular rhythm and intact distal pulses.   Pulmonary/Chest: Effort normal and breath sounds normal.  Abdominal: Soft. Bowel sounds are normal. She exhibits no distension and no mass. There is tenderness in the right upper quadrant and epigastric area. There is guarding. There is no rebound.  Neurological: She is alert.  Skin: Skin is warm and dry.  Psychiatric: She has a normal mood and affect.  Nursing note and vitals reviewed.   ED Course  Procedures (including critical care time) Labs Review Labs Reviewed  CBC WITH DIFFERENTIAL/PLATELET - Abnormal; Notable for the following:    RBC 5.64 (*)    Hemoglobin 15.1 (*)    RDW 16.5 (*)    All other components within normal limits  COMPREHENSIVE METABOLIC PANEL - Abnormal; Notable for the following:    Glucose, Bld 117 (*)    All other components within normal limits  LIPASE, BLOOD  URINALYSIS, ROUTINE W REFLEX MICROSCOPIC  TROPONIN I    Imaging Review Dg Abd 2 Views  10/11/2014   CLINICAL DATA:  Epigastric pain and nausea and vomiting with ileus emesis for the past 5 days ; suspect suspect bowel obstruction  EXAM: ABDOMEN - 2 VIEW  COMPARISON:  None.  FINDINGS: The small and large bowel gas patterns are normal. There is a small amount of gas in the stomach. There are no free extraluminal gas  collections. There are no abnormal soft tissue calcifications. The bony structures are unremarkable. The lung bases are clear.  IMPRESSION: There is no acute intra-abdominal abnormality demonstrated on this two-view series.   Electronically Signed   By: David  [place]   On: 10/11/2014 12:51   US Abdomen Limited Ruq  10/11/2014   CLINICAL DATA:  Acute onset of right upper quadrant abdominal pain, nausea and vomiting. Initial encounter.  EXAM: US ABDOMEN LIMITED - RIGHT UPPER QUADRANT  COMPARISON:  None.  FINDINGS: Gallbladder:  Stones are noted within the gallbladder, measuring up to 1.4 cm. These are mobile in nature. No gallbladder wall thickening or pericholecystic fluid is seen. No ultrasonographic Murphy's sign is elicited.  Common bile duct:  Diameter: 0.3 cm, within normal limits in caliber.  Liver:  No focal lesion identified. Diffusely increased parenchymal echogenicity and coarsened echotexture, compatible with fatty infiltration.  IMPRESSION: 1. Cholelithiasis; gallbladder otherwise unremarkable in appearance. 2. Diffuse fatty infiltration within the liver.   Electronically Signed   By: Roanna Raider M.D.   On: 10/11/2014 19:14     EKG Interpretation   Date/Time:  Thursday October 11 2014 17:00:44 EST  Ventricular Rate:  71 PR Interval:  153 QRS Duration: 81 QT Interval:  398 QTC Calculation: 432 R Axis:   61 Text Interpretation:  Sinus rhythm Sinus rhythm Normal ECG Confirmed by  Gerhard Munch  MD [phone]) on 10/11/2014 5:06:32 PM      MDM   Final diagnoses:  Calculus of gallbladder without cholecystitis without obstruction    Theresa Borrow presents with waxing and waning epigastric abd pain with intermittent vomiting x 4 days.  Concern for cholecystitis vs gastritis vs pancreatitis.  Will check labs, abd [place], give pain control and reassess.    8:32 PM [place] with cholelithiasis without evidence of cholecystitis or dilated common bile duct. Patient's waxing and waning cramping abdominal  pain is consistent with gallstones. Patient will be discharged home with pain control and nausea medicine. She is to follow-up with [facility] within the next several days for discussion of elective cholecystectomy.  Repeat abdominal exam is benign without rebound, peritoneal signs or persistent tenderness.  I have personally reviewed patient's vitals, nursing note and any pertinent labs or imaging.  I performed an undressed physical exam.    It has been determined that no acute conditions requiring further emergency intervention are present at this time. The patient/guardian have been advised of the diagnosis and plan. I reviewed all labs and imaging including any potential incidental findings. We have discussed signs and symptoms that warrant return to the ED and they are listed in the discharge instructions.    Vital signs are stable at discharge.   BP 124/73 mmHg  Pulse 70  Temp(Src) 97.8 F (36.6 C) (Oral)  Resp 18  SpO2 97%  LMP 04/12/2014        Dahlia Client Annaleia Pence, PA-C 10/11/14 2038  Gerhard Munch, MD 10/11/14 [phone]

## 2014-10-11 NOTE — Telephone Encounter (Signed)
Called to discuss plain film of abd. No obstructive gas pattern and no acute findings. She still has symptoms and zofran is not helping her nausea. Trial phenergan. Recommended returning if she does not improve or worsens. Also told her she may need to go to the ED if she isnt able to tolerate fluids.   Murtis SinkSam Bettyanne Dittman, MD [facility] Resident, PGY-3 10/11/2014, 2:03 PM

## 2014-10-11 NOTE — Discharge Instructions (Signed)
1. Medications: zofran, vicodin, usual home medications 2. Treatment: rest, drink plenty of fluids, advance diet slowly 3. Follow Up: Please followup with your primary doctor in 2 days for discussion of your diagnoses and further evaluation after today's visit; if you do not have a primary care doctor use the resource guide provided to find one; Please return to the ER for persistent vomiting, high fevers or worsening symptoms   Cholelithiasis Cholelithiasis (also called gallstones) is a form of gallbladder disease in which gallstones form in your gallbladder. The gallbladder is an organ that stores bile made in the liver, which helps digest fats. Gallstones begin as small crystals and slowly grow into stones. Gallstone pain occurs when the gallbladder spasms and a gallstone is blocking the duct. Pain can also occur when a stone passes out of the duct.  RISK FACTORS  Being female.   Having multiple pregnancies. Health care providers sometimes advise removing diseased gallbladders before future pregnancies.   Being obese.  Eating a diet heavy in fried foods and fat.   Being older than 60 years and increasing age.   Prolonged use of medicines containing female hormones.   Having diabetes mellitus.   Rapidly losing weight.   Having a family history of gallstones (heredity).  SYMPTOMS  Nausea.   Vomiting.  Abdominal pain.   Yellowing of the skin (jaundice).   Sudden pain. It may persist from several minutes to several hours.  Fever.   Tenderness to the touch. In some cases, when gallstones do not move into the bile duct, people have no pain or symptoms. These are called "silent" gallstones.  TREATMENT Silent gallstones do not need treatment. In severe cases, emergency surgery may be required. Options for treatment include:  Surgery to remove the gallbladder. This is the most common treatment.  Medicines. These do not always work and may take 6-12 months or more  to work.  Shock wave treatment (extracorporeal biliary lithotripsy). In this treatment an ultrasound machine sends shock waves to the gallbladder to break gallstones into smaller pieces that can pass into the intestines or be dissolved by medicine. HOME CARE INSTRUCTIONS   Only take over-the-counter or prescription medicines for pain, discomfort, or fever as directed by your health care provider.   Follow a low-fat diet until seen again by your health care provider. Fat causes the gallbladder to contract, which can result in pain.   Follow up with your health care provider as directed. Attacks are almost always recurrent and surgery is usually required for permanent treatment.  SEEK IMMEDIATE MEDICAL CARE IF:   Your pain increases and is not controlled by medicines.   You have a fever or persistent symptoms for more than 2-3 days.   You have a fever and your symptoms suddenly get worse.   You have persistent nausea and vomiting.  MAKE SURE YOU:   Understand these instructions.  Will watch your condition.  Will get help right away if you are not doing well or get worse. Document Released: 07/23/2005 Document Revised: 03/29/2013 Document Reviewed: 01/18/2013 [facility] Patient Information 2015 [place], [place]. This information is not intended to replace advice given to you by your health care provider. Make sure you discuss any questions you have with your health care provider.

## 2014-10-11 NOTE — ED Notes (Signed)
Pt reports epigastric pain, nausea. Denies vomiting or diarrhea. Pt describes pain as burning. No Hx of acid reflux. No hx HTN

## 2014-10-12 ENCOUNTER — Telehealth: Payer: Self-pay | Admitting: Family Medicine

## 2014-10-12 ENCOUNTER — Other Ambulatory Visit: Payer: Self-pay | Admitting: Family Medicine

## 2014-10-12 DIAGNOSIS — K8020 Calculus of gallbladder without cholecystitis without obstruction: Secondary | ICD-10-CM

## 2014-10-12 NOTE — Telephone Encounter (Signed)
Pt seen in ED last night, says she was told to contact us because she needs to be scheduled for surgery, pt says she has gallstones.

## 2014-10-12 NOTE — Telephone Encounter (Signed)
According to the ED notes [facility]. I will enter a referral and please go ahead and make the appointment for her and let her know when it is.

## 2014-10-12 NOTE — Telephone Encounter (Signed)
Referral faxed to [facility], they will contact patient with appointment. Patient informed.

## 2014-10-23 ENCOUNTER — Ambulatory Visit: Payer: Self-pay | Admitting: General Surgery

## 2014-10-29 ENCOUNTER — Encounter (HOSPITAL_COMMUNITY)
Admission: RE | Admit: 2014-10-29 | Discharge: 2014-10-29 | Disposition: A | Discharge status: Home / Self Care | Payer: No Typology Code available for payment source | Source: Ambulatory Visit | Attending: General Surgery | Admitting: General Surgery

## 2014-10-29 ENCOUNTER — Encounter (HOSPITAL_COMMUNITY): Payer: Self-pay

## 2014-10-29 DIAGNOSIS — K8020 Calculus of gallbladder without cholecystitis without obstruction: Secondary | ICD-10-CM | POA: Diagnosis not present

## 2014-10-29 DIAGNOSIS — K2970 Gastritis, unspecified, without bleeding: Secondary | ICD-10-CM | POA: Diagnosis not present

## 2014-10-29 HISTORY — DX: Calculus of gallbladder without cholecystitis without obstruction: K80.20

## 2014-10-29 LAB — COMPREHENSIVE METABOLIC PANEL
ALBUMIN: 4.4 g/dL (ref 3.5–5.2)
ALK PHOS: 67 U/L (ref 39–117)
ALT: 45 U/L — AB (ref 0–35)
AST: 31 U/L (ref 0–37)
Anion gap: 7 (ref 5–15)
BUN: 6 mg/dL (ref 6–23)
CO2: 28 mmol/L (ref 19–32)
Calcium: 9.4 mg/dL (ref 8.4–10.5)
Chloride: 105 mmol/L (ref 96–112)
Creatinine, Ser: 0.57 mg/dL (ref 0.50–1.10)
GFR calc Af Amer: >90 mL/min (ref >90)
GFR calc non Af Amer: >90 mL/min (ref >90)
Glucose, Bld: 101 mg/dL — ABNORMAL HIGH (ref 70–99)
Potassium: 3.6 mmol/L (ref 3.5–5.1)
SODIUM: 140 mmol/L (ref 135–145)
TOTAL PROTEIN: 7.9 g/dL (ref 6.0–8.3)
Total Bilirubin: 0.7 mg/dL (ref 0.3–1.2)

## 2014-10-29 LAB — CBC WITH DIFFERENTIAL/PLATELET
Basophils Absolute: 0 10*3/uL (ref 0.0–0.1)
Basophils Relative: 1 % (ref 0–1)
EOS PCT: 2 % (ref 0–5)
Eosinophils Absolute: 0.1 10*3/uL (ref 0.0–0.7)
HCT: 43.3 % (ref 36.0–46.0)
Hemoglobin: 14.3 g/dL (ref 12.0–15.0)
LYMPHS ABS: 2.7 10*3/uL (ref 0.7–4.0)
LYMPHS PCT: 44 % (ref 12–46)
MCH: 26.4 pg (ref 26.0–34.0)
MCHC: 33 g/dL (ref 30.0–36.0)
MCV: 80 fL (ref 78.0–100.0)
MONOS PCT: 4 % (ref 3–12)
Monocytes Absolute: 0.3 10*3/uL (ref 0.1–1.0)
Neutro Abs: 3.1 10*3/uL (ref 1.7–7.7)
Neutrophils Relative %: 49 % (ref 43–77)
PLATELETS: 373 10*3/uL (ref 150–400)
RBC: 5.41 MIL/uL — ABNORMAL HIGH (ref 3.87–5.11)
RDW: 17.4 % — ABNORMAL HIGH (ref 11.5–15.5)
WBC: 6.3 10*3/uL (ref 4.0–10.5)

## 2014-10-29 LAB — HCG, SERUM, QUALITATIVE: Preg, Serum: NEGATIVE

## 2014-10-29 NOTE — Pre-Procedure Instructions (Signed)
Christianne Borrowancy Che Levey  10/29/2014   Your procedure is scheduled on:  11/01/2014  [facility]:15 AM.  Call this number if you have problems the morning of surgery: [phone]   Remember:              Stop vitamin supplements and Fish oil .   Do not eat food or drink liquids after midnight. On Wednesday Night   Take these medicines the morning of surgery with A SIP OF WATER: none   Do not wear jewelry, make-up or nail polish.   Do not wear lotions, powders, or perfumes. Do not  wear deodorant.    Do not shave 48 hours prior to surgery.    Do not bring valuables to the hospital.  [facility] is not responsible   for any belongings or valuables.               Contacts, dentures or bridgework may not be worn into surgery.   Leave suitcase in the car. After surgery it may be brought to your room.   For patients admitted to the hospital, discharge time is determined by your    treatment team.               Patients discharged the day of surgery will not be allowed to drive Home.   Name and phone number of your driver: Edgar FriskBrother, [place], [phone]    Special Instructions: Special Instructions: [facility] Pre[facility]  Before surgery, you can play an important role.  Because skin is not sterile, your skin needs to be as free of germs as possible.  You can reduce the number of germs on you skin by washing with CHG (chlorahexidine gluconate) soap before surgery.  CHG is an antiseptic cleaner which kills germs and bonds with the skin to continue killing germs even after washing.  Please DO NOT use if you have an allergy to CHG or antibacterial soaps.  If your skin becomes reddened/irritated stop using the CHG and inform your nurse when you arrive at Short Stay.  Do not shave (including legs and underarms) for at least 48 hours prior to the first CHG shower.  You may shave your face.  Please follow these instructions carefully:   1.  Shower with  CHG Soap the night before surgery and the  morning of Surgery.  2.  If you choose to wash your hair, wash your hair first as usual with your  normal shampoo.  3.  After you shampoo, rinse your hair and body thoroughly to remove the  Shampoo.  4.  Use CHG as you would any other liquid soap.  You can apply chg directly to the skin and wash gently with scrungie or a clean washcloth.  5.  Apply the CHG Soap to your body ONLY FROM THE NECK DOWN.    Do not use on open wounds or open sores.  Avoid contact with your eyes, ears, mouth and genitals (private parts).  Wash genitals (private parts)   with your normal soap.  6.  Wash thoroughly, paying special attention to the area where your surgery will be performed.  7.  Thoroughly rinse your body with warm water from the neck down.  8.  DO NOT shower/wash with your normal soap after using and rinsing off   the CHG Soap.  9.  Pat yourself dry with a clean towel.  10.  Wear clean pajamas.            11.  Place clean sheets on your bed the night of your first shower and do not sleep with pets.  Day of Surgery  Do not apply any lotions/deodorants the morning of surgery.  Please wear clean clothes to the hospital/surgery center.   Please read over the following fact sheets that you were given: Pain Booklet, Coughing and Deep Breathing and Surgical Site Infection Prevention

## 2014-10-31 MED ORDER — CHLORHEXIDINE GLUCONATE 4 % EX LIQD
1.0000 "application " | Freq: Once | CUTANEOUS | Status: DC
  Filled 2014-10-31: qty 15

## 2014-10-31 MED ORDER — DEXTROSE 5 % IV SOLN
2.0000 g | INTRAVENOUS | Status: AC
  Administered 2014-11-01: 2 g via INTRAVENOUS
  Filled 2014-10-31: qty 2

## 2014-10-31 NOTE — Progress Notes (Signed)
Patient called with time change. inst to arrive at 530. Spoke with patient

## 2014-10-31 NOTE — Anesthesia Preprocedure Evaluation (Addendum)
Anesthesia Evaluation  Patient identified by MRN, date of birth, ID band Patient awake    Reviewed: Allergy & Precautions, NPO status , Patient's Chart, lab work & pertinent test results  Airway Mallampati: II  TM Distance: >3 FB Neck ROM: Full    Dental  (+) Dental Advisory Given, Teeth Intact   Pulmonary  breath sounds clear to auscultation        Cardiovascular Rhythm:Regular  EKG WNL #/2016   Neuro/Psych negative neurological ROS  negative psych ROS   GI/Hepatic negative GI ROS, Neg liver ROS,   Endo/Other  negative endocrine ROS  Renal/GU negative Renal ROS     Musculoskeletal   Abdominal (+)  Abdomen: soft.    Peds  Hematology 14/43   Anesthesia Other Findings   Reproductive/Obstetrics HCG neg                          Anesthesia Physical Anesthesia Plan  ASA: I  Anesthesia Plan: General   Post-op Pain Management:    Induction: Intravenous  Airway Management Planned: Oral ETT  Additional Equipment:   Intra-op Plan:   Post-operative Plan: Extubation in OR  Informed Consent: I have reviewed the patients History and Physical, chart, labs and discussed the procedure including the risks, benefits and alternatives for the proposed anesthesia with the patient or authorized representative who has indicated his/her understanding and acceptance.     Plan Discussed with:   Anesthesia Plan Comments: (Multimodal pain rx)        Anesthesia Quick Evaluation

## 2014-11-01 ENCOUNTER — Ambulatory Visit (HOSPITAL_COMMUNITY): Payer: No Typology Code available for payment source | Admitting: Anesthesiology

## 2014-11-01 ENCOUNTER — Ambulatory Visit (HOSPITAL_COMMUNITY)
Admission: RE | Admit: 2014-11-01 | Discharge: 2014-11-01 | Disposition: A | Discharge status: Home / Self Care | Payer: No Typology Code available for payment source | Source: Ambulatory Visit | Attending: General Surgery | Admitting: General Surgery

## 2014-11-01 ENCOUNTER — Encounter (HOSPITAL_COMMUNITY): Payer: Self-pay | Admitting: *Deleted

## 2014-11-01 ENCOUNTER — Encounter (HOSPITAL_COMMUNITY)
Admission: RE | Disposition: A | Discharge status: Home / Self Care | Payer: Self-pay | Source: Ambulatory Visit | Attending: General Surgery

## 2014-11-01 DIAGNOSIS — K2970 Gastritis, unspecified, without bleeding: Secondary | ICD-10-CM | POA: Insufficient documentation

## 2014-11-01 DIAGNOSIS — K8020 Calculus of gallbladder without cholecystitis without obstruction: Secondary | ICD-10-CM | POA: Diagnosis not present

## 2014-11-01 HISTORY — PX: CHOLECYSTECTOMY: SHX55

## 2014-11-01 SURGERY — LAPAROSCOPIC CHOLECYSTECTOMY WITH INTRAOPERATIVE CHOLANGIOGRAM
Anesthesia: General | Site: Abdomen

## 2014-11-01 MED ORDER — ONDANSETRON HCL 4 MG/2ML IJ SOLN
INTRAMUSCULAR | Status: AC
  Filled 2014-11-01: qty 2

## 2014-11-01 MED ORDER — DEXTROSE 5 % IV SOLN
INTRAVENOUS | Status: DC | PRN
  Administered 2014-11-01: 08:00:00 via INTRAVENOUS

## 2014-11-01 MED ORDER — BUPIVACAINE HCL (PF) 0.25 % IJ SOLN
INTRAMUSCULAR | Status: AC
  Filled 2014-11-01: qty 30

## 2014-11-01 MED ORDER — MIDAZOLAM HCL 2 MG/2ML IJ SOLN
INTRAMUSCULAR | Status: AC
  Filled 2014-11-01: qty 2

## 2014-11-01 MED ORDER — NEOSTIGMINE METHYLSULFATE 10 MG/10ML IV SOLN
INTRAVENOUS | Status: DC | PRN
  Administered 2014-11-01: 4 mg via INTRAVENOUS

## 2014-11-01 MED ORDER — ONDANSETRON HCL 4 MG/2ML IJ SOLN
INTRAMUSCULAR | Status: DC | PRN
  Administered 2014-11-01: 4 mg via INTRAVENOUS

## 2014-11-01 MED ORDER — PROMETHAZINE HCL 25 MG/ML IJ SOLN: 6.2500 mg | INTRAMUSCULAR | Status: DC | PRN

## 2014-11-01 MED ORDER — FENTANYL CITRATE 0.05 MG/ML IJ SOLN
INTRAMUSCULAR | Status: DC | PRN
  Administered 2014-11-01 (×3): 50 ug via INTRAVENOUS

## 2014-11-01 MED ORDER — PROPOFOL 10 MG/ML IV BOLUS
INTRAVENOUS | Status: DC | PRN
  Administered 2014-11-01: 140 mg via INTRAVENOUS

## 2014-11-01 MED ORDER — HYDROCODONE-ACETAMINOPHEN 5-325 MG PO TABS: 1.0000 | ORAL_TABLET | ORAL | Status: DC | PRN

## 2014-11-01 MED ORDER — MIDAZOLAM HCL 5 MG/5ML IJ SOLN
INTRAMUSCULAR | Status: DC | PRN
  Administered 2014-11-01: 0.5 mg via INTRAVENOUS
  Administered 2014-11-01: 1 mg via INTRAVENOUS
  Administered 2014-11-01: 0.5 mg via INTRAVENOUS

## 2014-11-01 MED ORDER — FENTANYL CITRATE 0.05 MG/ML IJ SOLN
INTRAMUSCULAR | Status: AC
  Filled 2014-11-01: qty 2

## 2014-11-01 MED ORDER — LIDOCAINE HCL (CARDIAC) 20 MG/ML IV SOLN
INTRAVENOUS | Status: DC | PRN
  Administered 2014-11-01: 100 mg via INTRAVENOUS

## 2014-11-01 MED ORDER — SODIUM CHLORIDE 0.9 % IR SOLN
Status: DC | PRN
  Administered 2014-11-01: 1000 mL

## 2014-11-01 MED ORDER — GLYCOPYRROLATE 0.2 MG/ML IJ SOLN
INTRAMUSCULAR | Status: DC | PRN
  Administered 2014-11-01: 0.6 mg via INTRAVENOUS

## 2014-11-01 MED ORDER — PROPOFOL 10 MG/ML IV BOLUS
INTRAVENOUS | Status: AC
  Filled 2014-11-01: qty 20

## 2014-11-01 MED ORDER — BUPIVACAINE-EPINEPHRINE (PF) 0.25% -1:200000 IJ SOLN
INTRAMUSCULAR | Status: AC
  Filled 2014-11-01: qty 30

## 2014-11-01 MED ORDER — ROCURONIUM BROMIDE 50 MG/5ML IV SOLN
INTRAVENOUS | Status: AC
  Filled 2014-11-01: qty 1

## 2014-11-01 MED ORDER — LIDOCAINE HCL (CARDIAC) 20 MG/ML IV SOLN
INTRAVENOUS | Status: AC
  Filled 2014-11-01: qty 5

## 2014-11-01 MED ORDER — FENTANYL CITRATE 0.05 MG/ML IJ SOLN
25.0000 ug | INTRAMUSCULAR | Status: DC | PRN
  Administered 2014-11-01: 25 ug via INTRAVENOUS

## 2014-11-01 MED ORDER — DEXAMETHASONE SODIUM PHOSPHATE 4 MG/ML IJ SOLN
INTRAMUSCULAR | Status: DC | PRN
  Administered 2014-11-01: 8 mg via INTRAVENOUS

## 2014-11-01 MED ORDER — SODIUM CHLORIDE 0.9 % IV SOLN
INTRAVENOUS | Status: DC | PRN
  Administered 2014-11-01: 50 mL

## 2014-11-01 MED ORDER — LACTATED RINGERS IV SOLN
INTRAVENOUS | Status: DC | PRN
  Administered 2014-11-01: 07:00:00 via INTRAVENOUS

## 2014-11-01 MED ORDER — MEPERIDINE HCL 25 MG/ML IJ SOLN: 6.2500 mg | INTRAMUSCULAR | Status: DC | PRN

## 2014-11-01 MED ORDER — FENTANYL CITRATE 0.05 MG/ML IJ SOLN
INTRAMUSCULAR | Status: AC
  Filled 2014-11-01: qty 5

## 2014-11-01 MED ORDER — DEXAMETHASONE SODIUM PHOSPHATE 4 MG/ML IJ SOLN
INTRAMUSCULAR | Status: AC
  Filled 2014-11-01: qty 2

## 2014-11-01 MED ORDER — GLYCOPYRROLATE 0.2 MG/ML IJ SOLN
INTRAMUSCULAR | Status: AC
  Filled 2014-11-01: qty 3

## 2014-11-01 MED ORDER — BUPIVACAINE-EPINEPHRINE 0.25% -1:200000 IJ SOLN
INTRAMUSCULAR | Status: DC | PRN
  Administered 2014-11-01: 20 mL

## 2014-11-01 MED ORDER — NEOSTIGMINE METHYLSULFATE 10 MG/10ML IV SOLN
INTRAVENOUS | Status: AC
  Filled 2014-11-01: qty 1

## 2014-11-01 MED ORDER — 0.9 % SODIUM CHLORIDE (POUR BTL) OPTIME
TOPICAL | Status: DC | PRN
  Administered 2014-11-01: 1000 mL

## 2014-11-01 MED ORDER — ROCURONIUM BROMIDE 100 MG/10ML IV SOLN
INTRAVENOUS | Status: DC | PRN
  Administered 2014-11-01: 30 mg via INTRAVENOUS

## 2014-11-01 SURGICAL SUPPLY — 43 items
APPLIER CLIP 5 13 M/L LIGAMAX5 (MISCELLANEOUS) ×2
BLADE SURG ROTATE 9660 (MISCELLANEOUS) ×2 IMPLANT
CANISTER SUCTION 2500CC (MISCELLANEOUS) ×2 IMPLANT
CHLORAPREP W/TINT 26ML (MISCELLANEOUS) ×2 IMPLANT
CLIP APPLIE 5 13 M/L LIGAMAX5 (MISCELLANEOUS) ×1 IMPLANT
COVER MAYO STAND STRL (DRAPES) ×2 IMPLANT
COVER SURGICAL LIGHT HANDLE (MISCELLANEOUS) ×2 IMPLANT
DRAPE C-ARM 42X72 X-RAY (DRAPES) ×2 IMPLANT
DRAPE LAPAROSCOPIC ABDOMINAL (DRAPES) ×2 IMPLANT
DRSG TEGADERM 2-3/8X2-3/4 SM (GAUZE/BANDAGES/DRESSINGS) ×2 IMPLANT
ELECT REM PT RETURN 9FT ADLT (ELECTROSURGICAL) ×2
ELECTRODE REM PT RTRN 9FT ADLT (ELECTROSURGICAL) ×1 IMPLANT
GLOVE BIO SURGEON STRL SZ 6.5 (GLOVE) ×2 IMPLANT
GLOVE BIOGEL PI IND STRL 6.5 (GLOVE) ×1 IMPLANT
GLOVE BIOGEL PI IND STRL 7.0 (GLOVE) ×3 IMPLANT
GLOVE BIOGEL PI IND STRL 8 (GLOVE) ×1 IMPLANT
GLOVE BIOGEL PI INDICATOR 6.5 (GLOVE) ×1
GLOVE BIOGEL PI INDICATOR 7.0 (GLOVE) ×3
GLOVE BIOGEL PI INDICATOR 8 (GLOVE) ×1
GLOVE ECLIPSE 7.5 STRL STRAW (GLOVE) ×2 IMPLANT
GLOVE SURG SS PI 7.0 STRL IVOR (GLOVE) ×4 IMPLANT
GOWN STRL REUS W/ TWL LRG LVL3 (GOWN DISPOSABLE) ×3 IMPLANT
GOWN STRL REUS W/TWL 2XL LVL3 (GOWN DISPOSABLE) ×2 IMPLANT
GOWN STRL REUS W/TWL LRG LVL3 (GOWN DISPOSABLE) ×3
KIT BASIN OR (CUSTOM PROCEDURE TRAY) ×2 IMPLANT
KIT ROOM TURNOVER OR (KITS) ×2 IMPLANT
LIQUID BAND (GAUZE/BANDAGES/DRESSINGS) ×2 IMPLANT
NS IRRIG 1000ML POUR BTL (IV SOLUTION) ×2 IMPLANT
PAD ARMBOARD 7.5X6 YLW CONV (MISCELLANEOUS) ×2 IMPLANT
POUCH SPECIMEN RETRIEVAL 10MM (ENDOMECHANICALS) IMPLANT
SCISSORS LAP 5X35 DISP (ENDOMECHANICALS) ×2 IMPLANT
SET CHOLANGIOGRAPH 5 50 .035 (SET/KITS/TRAYS/PACK) ×2 IMPLANT
SET IRRIG TUBING LAPAROSCOPIC (IRRIGATION / IRRIGATOR) ×2 IMPLANT
SLEEVE ENDOPATH XCEL 5M (ENDOMECHANICALS) ×4 IMPLANT
SPECIMEN JAR SMALL (MISCELLANEOUS) ×2 IMPLANT
STRIP CLOSURE SKIN 1/2X4 (GAUZE/BANDAGES/DRESSINGS) ×2 IMPLANT
SUT MNCRL AB 4-0 PS2 18 (SUTURE) ×2 IMPLANT
TOWEL OR 17X24 6PK STRL BLUE (TOWEL DISPOSABLE) ×2 IMPLANT
TOWEL OR 17X26 10 PK STRL BLUE (TOWEL DISPOSABLE) IMPLANT
TRAY LAPAROSCOPIC (CUSTOM PROCEDURE TRAY) ×2 IMPLANT
TROCAR XCEL BLUNT TIP 100MML (ENDOMECHANICALS) ×2 IMPLANT
TROCAR XCEL NON-BLD 5MMX100MML (ENDOMECHANICALS) ×2 IMPLANT
TUBING INSUFFLATION (TUBING) ×2 IMPLANT

## 2014-11-01 NOTE — Progress Notes (Signed)
Called Dr.Manny RN for sign out

## 2014-11-01 NOTE — Addendum Note (Signed)
Addendum  created 11/01/14 1708 by Marni GriffonKaren B Juana Montini, CRNA   Modules edited: Anesthesia Events, Narrator   Narrator:  Narrator: Event Log Edited

## 2014-11-01 NOTE — H&P (Signed)
Theresa Diaz 10/23/2014 10:52 AM Location: [facility] Patient #: [id number] DOB: 11/03/1965 Married / Language: English / Race: Asian Female  History of Present Illness (Ammie Eversole LPN; 5/62/13083/15/2016 65:7810:53 AM) Patient words: postop.  The patient is a 49 year old female    Other Problems (Ammie Eversole, LPN; 4/69/62953/15/2016 28:4110:52 AM) No pertinent past medical history  Past Surgical History (Ammie Eversole, LPN; 3/24/40103/15/2016 27:2510:52 AM) Cesarean Section - 1  Diagnostic Studies History (Ammie Eversole, LPN; 3/66/44033/15/2016 47:4210:52 AM) Colonoscopy never Mammogram within last year Pap Smear 1-5 years ago  Allergies (Ammie Eversole, LPN; 5/95/63873/15/2016 56:4310:53 AM) No Known Drug Allergies03/15/2016  Medication History (Ammie Eversole, LPN; 3/29/51883/15/2016 41:6610:53 AM) Hydrocodone-Acetaminophen (5-325MG  Tablet, Oral) Active. Ondansetron (4MG  Tablet Disperse, Oral) Active. Promethazine HCl (12.5MG  Tablet, Oral) Active.  Social History (Ammie Eversole, LPN; 0/63/01603/15/2016 10:9310:52 AM) Caffeine use Coffee, Tea. No alcohol use No drug use Tobacco use Never smoker.  Family History Deon Pilling(Ammie Eversole, LPN; 2/35/57323/15/2016 20:2510:52 AM) Diabetes Mellitus Sister. Hypertension Mother.  Pregnancy / Birth History Deon Pilling(Ammie Eversole, LPN; 4/27/06233/15/2016 76:2810:52 AM) Age at menarche 15 years. Age of menopause 2346-50 Gravida 2 Maternal age 48-30 Para 2 Regular periods  Review of Systems (Ammie Eversole LPN; 3/15/17613/15/2016 60:7310:52 AM) General Present- Appetite Loss and Fatigue. Not Present- Chills, Fever, Night Sweats, Weight Gain and Weight Loss. Skin Not Present- Change in Wart/Mole, Dryness, Hives, Jaundice, New Lesions, Non-Healing Wounds, Rash and Ulcer. HEENT Not Present- Earache, Hearing Loss, Hoarseness, Nose Bleed, Oral Ulcers, Ringing in the Ears, Seasonal Allergies, Sinus Pain, Sore Throat, Visual Disturbances, Wears glasses/contact lenses and Yellow Eyes. Respiratory Not Present- Bloody sputum, Chronic Cough,  Difficulty Breathing, Snoring and Wheezing. Breast Not Present- Breast Mass, Breast Pain, Nipple Discharge and Skin Changes. Cardiovascular Not Present- Chest Pain, Difficulty Breathing Lying Down, Leg Cramps, Palpitations, Rapid Heart Rate, Shortness of Breath and Swelling of Extremities. Gastrointestinal Not Present- Abdominal Pain, Bloating, Bloody Stool, Change in Bowel Habits, Chronic diarrhea, Constipation, Difficulty Swallowing, Excessive gas, Gets full quickly at meals, Hemorrhoids, Indigestion, Nausea, Rectal Pain and Vomiting. Female Genitourinary Not Present- Frequency, Nocturia, Painful Urination, Pelvic Pain and Urgency. Musculoskeletal Not Present- Back Pain, Joint Pain, Joint Stiffness, Muscle Pain, Muscle Weakness and Swelling of Extremities. Neurological Not Present- Decreased Memory, Fainting, Headaches, Numbness, Seizures, Tingling, Tremor, Trouble walking and Weakness. Psychiatric Not Present- Anxiety, Bipolar, Change in Sleep Pattern, Depression, Fearful and Frequent crying. Endocrine Not Present- Cold Intolerance, Excessive Hunger, Hair Changes, Heat Intolerance, Hot flashes and New Diabetes. Hematology Not Present- Easy Bruising, Excessive bleeding, Gland problems, HIV and Persistent Infections.   Vitals (Ammie Eversole LPN; 7/10/62693/15/2016 48:5410:53 AM) 10/23/2014 10:53 AM Weight: 143.8 lb Height: 60in Body Surface Area: 1.66 m Body Mass Index: 28.08 kg/m Pulse: 80 (Regular)  BP: 110/78 (Sitting, Left Arm, Standard)    Physical Exam (Cletis Clack O. Lindie SpruceWyatt MD; 10/23/2014 11:33 AM) General General Appearance-Cooperative, Well groomed and Consistent with stated age. Orientation-Oriented X4.  Head and Neck Head-normocephalic, atraumatic with no lesions or palpable masses.  Chest and Lung Exam Chest and lung exam reveals -normal excursion with symmetric chest walls, quiet, even and easy respiratory effort with no use of accessory muscles, non-tender and normal tactile  fremitus and on auscultation, normal breath sounds, no adventitious sounds and normal vocal resonance.  Cardiovascular Cardiovascular examination reveals -on palpation PMI is normal in location and amplitude, no palpable S3 or S4. Normal cardiac borders.. Auscultation Rhythm - Regular. Murmurs & Other Heart Sounds - Auscultation of the heart reveals - No Murmurs.  Abdomen  Palpation/Percussion Palpation and Percussion of the abdomen reveal - Soft and Non Tender. Note: No Murphy's sign. Auscultation Auscultation of the abdomen reveals - Bowel sounds normal.    Assessment & Plan Fayrene Fearing O. Ronit Marczak MD; 10/23/2014 11:33 AM) SYMPTOMATIC CHOLELITHIASIS (574.20  K80.20) Impression: One attack, recently diagnosed. Wants to have surgery ASAP. Risks and benefits explained to the patient and she wishes to proceed.    Marta Lamas. Gae Bon, MD, FACS [phone] [facility]

## 2014-11-01 NOTE — Progress Notes (Signed)
Holding for short stay

## 2014-11-01 NOTE — Progress Notes (Signed)
Discharged home with cousin; puncture sites unremarkable; up to BR voided qs

## 2014-11-01 NOTE — Anesthesia Postprocedure Evaluation (Signed)
  Anesthesia Post-op Note  Patient: Theresa Diaz  Procedure(s) Performed: Procedure(s): LAPAROSCOPIC CHOLECYSTECTOMY WITH ATTEMPTED INTRAOPERATIVE CHOLANGIOGRAM (N/A)  Patient Location: PACU  Anesthesia Type:General  Level of Consciousness: awake  Airway and Oxygen Therapy: Patient Spontanous Breathing and Patient connected to nasal cannula oxygen  Post-op Pain: moderate  Post-op Assessment: Post-op Vital signs reviewed and Patient's Cardiovascular Status Stable  Post-op Vital Signs: Reviewed and stable  Last Vitals:  Filed Vitals:   11/01/14 0611  BP: 113/70  Pulse: 70  Temp: 36.3 C  Resp: 18    Complications: No apparent anesthesia complications

## 2014-11-01 NOTE — Op Note (Addendum)
OPERATIVE REPORT  DATE OF OPERATION: 11/01/2014  PATIENT:  Theresa Diaz  49 y.o. female  PRE-OPERATIVE DIAGNOSIS:  Symptomatic cholelithiasis  POST-OPERATIVE DIAGNOSIS:  Symptomatic cholelithiasis  PROCEDURE:  Procedure(s): LAPAROSCOPIC CHOLECYSTECTOMY WITH ATTEMPTED INTRAOPERATIVE CHOLANGIOGRAM  SURGEON:  Surgeon(s): Frederik Schmidt, MD Romie Levee, MD  ASSISTANT: Maisie Fus, M.D.  ANESTHESIA:   general  EBL: <30 ml  BLOOD ADMINISTERED: none  DRAINS: none   SPECIMEN:  Source of Specimen:  Gallbladder and contents  COUNTS CORRECT:  YES  PROCEDURE DETAILS: The patient was taken to the operating room and placed on the table in the supine position.  After an adequate endotracheal anesthetic was administered, the patient was prepped with ChloroPrep, and then draped in the usual manner exposing the entire abdomen laterally, inferiorly and up  to the costal margins.  After a proper timeout was performed including identifying the patient and the procedure to be performed, a supraumbilical 1.5cm midline incision was made using a #15 blade.  This was taken down to the fascia which was then incised with a #15 blade.  The edges of the fascia were tented up with Kocher clamps as the preperitoneal space was penetrated with a Kelly clamp into the peritoneum.  Once this was done, a pursestring suture of 0 Vicryl was passed around the fascial opening.  This was subsequently used to secure the [facility] cannula which was passed into the peritoneal cavity.  Once the [facility] cannula was in place, carbon dioxide gas was insufflated into the peritoneal cavity up to a maximal intra-abdominal pressure of 15mm Hg.The laparoscope, with attached camera and light source, was passed into the peritoneal cavity to visualize the direct insertion of two right upper quadrant 5mm cannulas, and a sup-xiphoid 5mm cannula.  Once all cannulas were in place, the dissection was begun.  Two ratcheted graspers were attached to the  dome and infundibulum of the gallbladder and retracted towards the anterior abdominal wall and the right upper quadrant.   The patient has a very friable, soft, fatty liver and care was take during retraction.  Using cautery attached to a dissecting forceps, the peritoneum overlaying the triangle of Chalot and the hepatoduodenal triangle was dissected away exposing the cystic duct and the cystic artery.  The cystic artery was clipped proximally and distally then transected.  A clip was placed on the gallbladder side of the cystic duct, then a cholecystodochotomy made using the laparoscopic scissors.  Through the cholecystodochotomy multiple attempts were made to pass the catheter without success and the cholangiogram was not performed.  The distal cystic duct was clipped multiple times then the mid-portion of the cystic duct was transected.  The gallbladder was then dissected out of the hepatic bed without event.  It was retrieved from the abdomen (using an EndoCatch bag) without event.  Once the gallbladder was removed, the bed was inspected for hemostasis.  Once excellent hemostasis was obtained all gas and fluids were aspirated from above the liver, then the cannulas were removed.  The supraumbilical incision was closed using the pursestring suture which was in place.  0.25% bupivicaine with epinephrine was injected at all sites.  All 10mm or greater cannula sites were close using a running subcuticular stitch of 4-0 Monocryl.  5.61mm cannula sites were closed with Dermabond only.Steri-Strips and Tagaderm were used to complete the dressings at all sites.  At this point all needle, sponge, and instrument counts were correct.The patient was awakened from anesthesia and taken to the PACU in stable condition.  PATIENT DISPOSITION:  PACU - hemodynamically stable.   Simra Fiebig, JAY 3/24/20168:40 AM

## 2014-11-01 NOTE — Discharge Instructions (Addendum)

## 2014-11-01 NOTE — Transfer of Care (Signed)
Immediate Anesthesia Transfer of Care Note  Patient: Theresa BorrowNancy Che Milillo  Procedure(s) Performed: Procedure(s): LAPAROSCOPIC CHOLECYSTECTOMY WITH ATTEMPTED INTRAOPERATIVE CHOLANGIOGRAM (N/A)  Patient Location: PACU  Anesthesia Type:General  Level of Consciousness: awake, sedated and patient cooperative  Airway & Oxygen Therapy: Patient Spontanous Breathing and Patient connected to nasal cannula oxygen  Post-op Assessment: Report given to RN and Post -op Vital signs reviewed and stable  Post vital signs: stable  Last Vitals:  Filed Vitals:   11/01/14 0850  BP:   Pulse:   Temp: 36.6 C  Resp:     Complications: No apparent anesthesia complications

## 2014-11-01 NOTE — Anesthesia Procedure Notes (Signed)
Procedure Name: Intubation Date/Time: 11/01/2014 7:36 AM Performed by: Marni GriffonJAMES, Tushar Enns B Pre-anesthesia Checklist: Patient identified, Emergency Drugs available, Suction available and Patient being monitored Patient Re-evaluated:Patient Re-evaluated prior to inductionOxygen Delivery Method: Circle system utilized Preoxygenation: Pre-oxygenation with 100% oxygen Ventilation: Mask ventilation without difficulty Laryngoscope Size: Mac and 3 Grade View: Grade II Tube type: Oral Tube size: 7.5 mm Number of attempts: 1 Airway Equipment and Method: Stylet Placement Confirmation: ETT inserted through vocal cords under direct vision,  breath sounds checked- equal and bilateral and positive ETCO2 Secured at: 21 (cm at teeth) cm Tube secured with: Tape Dental Injury: Teeth and Oropharynx as per pre-operative assessment

## 2014-11-01 NOTE — Interval H&P Note (Signed)
History and Physical Interval Note: Doing well.  No more symptoms 11/01/2014 7:18 AM  Theresa BorrowNancy Che Antwine  has presented today for surgery, with the diagnosis of Symptomatic cholelithiasis  The various methods of treatment have been discussed with the patient and family. After consideration of risks, benefits and other options for treatment, the patient has consented to  Procedure(s): LAPAROSCOPIC CHOLECYSTECTOMY WITH INTRAOPERATIVE CHOLANGIOGRAM (N/A) as a surgical intervention .  The patient's history has been reviewed, patient examined, no change in status, stable for surgery.  I have reviewed the patient's chart and labs.  Questions were answered to the patient's satisfaction.     Lezley Bedgood, JAY

## 2014-11-05 ENCOUNTER — Encounter (HOSPITAL_COMMUNITY): Payer: Self-pay | Admitting: General Surgery

## 2014-11-30 ENCOUNTER — Ambulatory Visit: Payer: No Typology Code available for payment source | Admitting: Family Medicine

## 2015-01-17 ENCOUNTER — Other Ambulatory Visit: Payer: Self-pay | Admitting: Family Medicine

## 2015-01-17 DIAGNOSIS — Z1231 Encounter for screening mammogram for malignant neoplasm of breast: Secondary | ICD-10-CM

## 2015-01-30 ENCOUNTER — Ambulatory Visit (INDEPENDENT_AMBULATORY_CARE_PROVIDER_SITE_OTHER): Payer: No Typology Code available for payment source | Admitting: Family Medicine

## 2015-01-30 ENCOUNTER — Other Ambulatory Visit (HOSPITAL_COMMUNITY)
Admission: RE | Admit: 2015-01-30 | Discharge: 2015-01-30 | Disposition: A | Discharge status: Home / Self Care | Payer: No Typology Code available for payment source | Source: Ambulatory Visit | Attending: Family Medicine | Admitting: Family Medicine

## 2015-01-30 ENCOUNTER — Encounter: Payer: Self-pay | Admitting: Family Medicine

## 2015-01-30 VITALS — BP 116/67 | HR 77 | Temp 97.6°F | Ht 60.0 in | Wt 143.0 lb

## 2015-01-30 DIAGNOSIS — Z114 Encounter for screening for human immunodeficiency virus [HIV]: Secondary | ICD-10-CM | POA: Diagnosis not present

## 2015-01-30 DIAGNOSIS — Z124 Encounter for screening for malignant neoplasm of cervix: Secondary | ICD-10-CM

## 2015-01-30 DIAGNOSIS — Z1151 Encounter for screening for human papillomavirus (HPV): Secondary | ICD-10-CM | POA: Diagnosis present

## 2015-01-30 DIAGNOSIS — Z780 Asymptomatic menopausal state: Secondary | ICD-10-CM

## 2015-01-30 DIAGNOSIS — N915 Oligomenorrhea, unspecified: Secondary | ICD-10-CM

## 2015-01-30 DIAGNOSIS — R87810 Cervical high risk human papillomavirus (HPV) DNA test positive: Secondary | ICD-10-CM | POA: Diagnosis present

## 2015-01-30 DIAGNOSIS — Z01419 Encounter for gynecological examination (general) (routine) without abnormal findings: Secondary | ICD-10-CM | POA: Diagnosis not present

## 2015-01-30 DIAGNOSIS — Z0000 Encounter for general adult medical examination without abnormal findings: Secondary | ICD-10-CM

## 2015-01-30 LAB — HIV ANTIBODY (ROUTINE TESTING W REFLEX): HIV: NONREACTIVE

## 2015-01-30 NOTE — Assessment & Plan Note (Signed)
Well, no concerns Pap and HIV test today Mammo scheduled in september

## 2015-01-30 NOTE — Assessment & Plan Note (Signed)
Irregular periods, longest gap 8 months, sometimes periods are more like spotting - will check [facility] to assess menopause - re-address in 1 year, sooner if any concerns - cautioned that she may still be fertile, will continue natural family planning as she has for years

## 2015-01-30 NOTE — Patient Instructions (Signed)
Menopause Menopause is the normal time of life when menstrual periods stop completely. Menopause is complete when you have missed 12 consecutive menstrual periods. It usually occurs between the ages of 48 years and 55 years. Very rarely does a woman develop menopause before the age of 40 years. At menopause, your ovaries stop producing the female hormones estrogen and progesterone. This can cause undesirable symptoms and also affect your health. Sometimes the symptoms may occur 4-5 years before the menopause begins. There is no relationship between menopause and:  Oral contraceptives.  Number of children you had.  Race.  The age your menstrual periods started (menarche). Heavy smokers and very thin women may develop menopause earlier in life. CAUSES  The ovaries stop producing the female hormones estrogen and progesterone.  Other causes include:  Surgery to remove both ovaries.  The ovaries stop functioning for no known reason.  Tumors of the pituitary gland in the brain.  Medical disease that affects the ovaries and hormone production.  Radiation treatment to the abdomen or pelvis.  Chemotherapy that affects the ovaries. SYMPTOMS   Hot flashes.  Night sweats.  Decrease in sex drive.  Vaginal dryness and thinning of the vagina causing painful intercourse.  Dryness of the skin and developing wrinkles.  Headaches.  Tiredness.  Irritability.  Memory problems.  Weight gain.  Bladder infections.  Hair growth of the face and chest.  Infertility. More serious symptoms include:  Loss of bone (osteoporosis) causing breaks (fractures).  Depression.  Hardening and narrowing of the arteries (atherosclerosis) causing heart attacks and strokes. DIAGNOSIS   When the menstrual periods have stopped for 12 straight months.  Physical exam.  Hormone studies of the blood. TREATMENT  There are many treatment choices and nearly as many questions about them. The  decisions to treat or not to treat menopausal changes is an individual choice made with your health care provider. Your health care provider can discuss the treatments with you. Together, you can decide which treatment will work best for you. Your treatment choices may include:   Hormone therapy (estrogen and progesterone).  Non-hormonal medicines.  Treating the individual symptoms with medicine (for example antidepressants for depression).  Herbal medicines that may help specific symptoms.  Counseling by a psychiatrist or psychologist.  Group therapy.  Lifestyle changes including:  Eating healthy.  Regular exercise.  Limiting caffeine and alcohol.  Stress management and meditation.  No treatment. HOME CARE INSTRUCTIONS   Take the medicine your health care provider gives you as directed.  Get plenty of sleep and rest.  Exercise regularly.  Eat a diet that contains calcium (good for the bones) and soy products (acts like estrogen hormone).  Avoid alcoholic beverages.  Do not smoke.  If you have hot flashes, dress in layers.  Take supplements, calcium, and vitamin D to strengthen bones.  You can use over-the-counter lubricants or moisturizers for vaginal dryness.  Group therapy is sometimes very helpful.  Acupuncture may be helpful in some cases. SEEK MEDICAL CARE IF:   You are not sure you are in menopause.  You are having menopausal symptoms and need advice and treatment.  You are still having menstrual periods after age 55 years.  You have pain with intercourse.  Menopause is complete (no menstrual period for 12 months) and you develop vaginal bleeding.  You need a referral to a specialist (gynecologist, psychiatrist, or psychologist) for treatment. SEEK IMMEDIATE MEDICAL CARE IF:   You have severe depression.  You have excessive vaginal bleeding.    You fell and think you have a broken bone.  You have pain when you urinate.  You develop leg or  chest pain.  You have a fast pounding heart beat (palpitations).  You have severe headaches.  You develop vision problems.  You feel a lump in your breast.  You have abdominal pain or severe indigestion. Document Released: 10/17/2003 Document Revised: 03/29/2013 Document Reviewed: 02/23/2013 ExitCare Patient Information 2015 [employer]. This information is not intended to replace advice given to you by your health care provider. Make sure you discuss any questions you have with your health care provider.  

## 2015-01-30 NOTE — Progress Notes (Signed)
   Subjective:    Patient ID: Theresa Diaz, female    DOB: 09/02/1965, 49 y.o.   MRN: [id number]  HPI Pt presents for well woman exam with pap smear. Her only concern is that her periods are tapering off and she wonders if this is normal for her age. She has gone as long as 8 months without a period and sometimes her periods are very light, more like just spotting. She hasn't had any pain or other symptoms, no hot flashes, mood changes.   She is sexually active with her husband only. No concern for STIs. No discharge, itching, pelvic or vaginal pain.   Review of Systems See HPI    Objective:   Physical Exam  Constitutional: She is oriented to person, place, and time. She appears well-developed and well-nourished. No distress.  HENT:  Head: Normocephalic and atraumatic.  Eyes: Conjunctivae are normal. Right eye exhibits no discharge. Left eye exhibits no discharge.  Neck: Normal range of motion. Neck supple. No thyromegaly present.  Cardiovascular: Normal rate, regular rhythm and normal heart sounds.   No murmur heard. Pulmonary/Chest: Effort normal and breath sounds normal. No respiratory distress. She has no wheezes.  Abdominal: Soft. Bowel sounds are normal. She exhibits no distension. There is no tenderness.  Genitourinary: Vagina normal and uterus normal. No vaginal discharge found.  Musculoskeletal: She exhibits no edema.  Lymphadenopathy:    She has no cervical adenopathy.  Neurological: She is alert and oriented to person, place, and time.  Skin: Skin is warm and dry. She is not diaphoretic.  Psychiatric: She has a normal mood and affect. Her behavior is normal.  Nursing note and vitals reviewed.         Assessment & Plan:

## 2015-01-31 LAB — CYTOLOGY - PAP

## 2015-01-31 LAB — FOLLICLE STIMULATING HORMONE: FSH: 8.4 m[IU]/mL

## 2015-02-01 ENCOUNTER — Encounter: Payer: Self-pay | Admitting: Family Medicine

## 2015-02-05 ENCOUNTER — Encounter: Payer: Self-pay | Admitting: Family Medicine

## 2015-02-05 ENCOUNTER — Ambulatory Visit (HOSPITAL_COMMUNITY)
Admission: RE | Admit: 2015-02-05 | Discharge: 2015-02-05 | Disposition: A | Discharge status: Home / Self Care | Payer: No Typology Code available for payment source | Source: Ambulatory Visit | Attending: Family Medicine | Admitting: Family Medicine

## 2015-02-05 ENCOUNTER — Inpatient Hospital Stay (HOSPITAL_COMMUNITY): Admission: RE | Admit: 2015-02-05 | Payer: No Typology Code available for payment source | Source: Ambulatory Visit

## 2015-02-05 ENCOUNTER — Ambulatory Visit (INDEPENDENT_AMBULATORY_CARE_PROVIDER_SITE_OTHER): Payer: No Typology Code available for payment source | Admitting: Family Medicine

## 2015-02-05 VITALS — BP 119/76 | HR 81 | Temp 98.0°F | Ht 60.0 in | Wt 145.5 lb

## 2015-02-05 DIAGNOSIS — M25562 Pain in left knee: Secondary | ICD-10-CM

## 2015-02-05 DIAGNOSIS — M25569 Pain in unspecified knee: Secondary | ICD-10-CM | POA: Diagnosis present

## 2015-02-05 DIAGNOSIS — M25561 Pain in right knee: Secondary | ICD-10-CM

## 2015-02-05 MED ORDER — ACETAMINOPHEN 500 MG PO TABS
500.0000 mg | ORAL_TABLET | Freq: Four times a day (QID) | ORAL | Status: DC | PRN
Start: 2015-02-05 — End: 2017-10-18

## 2015-02-05 NOTE — Progress Notes (Signed)
Theresa Diaz is a 49 y.o. female [facility] for knee pain. Her concerns today include:  HPI:  Knee Pain Patient with bilateral knee pain for about a year, left greater than right. States that her pain has become much worse over the past few days. No recent traumas. Pain worse with walking and sitting and worse in the morning. Has not noticed anything that makes the pain better. Has tried over the counter FedExiger Balm and CheyenneSalonpas which have not helped very much. Has not tried any pain medications. Has not noticed any swelling. Feels cracking in her knees while walking. Has not had any imaging.  No fevers or chills. No weakness or numbness.    ROS: As per HPI  Past Medical History - Reviewed and updated Patient Active Problem List   Diagnosis Date Noted  . Knee pain, bilateral 02/05/2015  . Gastritis and gastroduodenitis 10/09/2014  . Hemorrhoids 10/09/2014  . Menopause 04/04/2013  . Stress incontinence, female 12/26/2012  . Preventative health care 12/18/2010    Medications- reviewed and updated Current Outpatient Prescriptions  Medication Sig Dispense Refill  . acetaminophen (TYLENOL) 500 MG tablet Take 1 tablet (500 mg total) by mouth every 6 (six) hours as needed. 30 tablet 3  . benzonatate (TESSALON) 100 MG capsule Take 1 capsule (100 mg total) by mouth 2 (two) times daily as needed for cough. (Patient not taking: Reported on 10/11/2014) 30 capsule 0  . fish oil-omega-3 fatty acids 1000 MG capsule Take 1 g by mouth daily.      . hydrocortisone (ANUSOL-HC) 2.5 % rectal cream Place 1 application rectally 2 (two) times daily. 30 g 0  . hydrocortisone (ANUSOL-HC) 25 MG suppository     . meclizine (ANTIVERT) 12.5 MG tablet Take 1 tablet (12.5 mg total) by mouth 3 (three) times daily as needed for dizziness. 30 tablet 0  . ondansetron (ZOFRAN ODT) 4 MG disintegrating tablet 4mg  ODT q4 hours prn nausea/vomit 10 tablet 0  . ondansetron (ZOFRAN)  4 MG tablet Take 1 tablet (4 mg total) by mouth every 8 (eight) hours as needed for nausea or vomiting. 20 tablet 0  . promethazine (PHENERGAN) 12.5 MG tablet Take 1 tablet (12.5 mg total) by mouth every 8 (eight) hours as needed for nausea or vomiting. 30 tablet 0   No current facility-administered medications for this visit.    Objective: Physical Exam: BP 119/76 mmHg  Pulse 81  Temp(Src) 98 F (36.7 C) (Oral)  Ht 5' (1.524 m)  Wt 145 lb 8 oz (65.998 kg)  BMI 28.42 kg/m2  LMP 07/30/2014 (Approximate)  Gen: NAD, resting comfortably CV: RRR with no murmurs appreciated Lungs: NWOB, CTAB with no crackles, wheezes, or rhonchi MSK:  - Right knee with no effusion and non-tender to palpation. FROM with mild crepitus noted on passive extension. No pain with valgus or varus stress.  - Left knee with no effusions. Mildly tender to palpation along inferolateral edge of femur at knee joint. FROM with mild crepitus noted on passive extension. No pain with valgus or varus stress.  Skin: warm, dry Neuro: grossly normal, moves all extremities  A/P: See problem list  Knee pain, bilateral History of exam consistent with mild OA. No signs or symptoms of inflammatory process. Will confirm diagnosis with plain films. Will treat with tylenol 500mg  q6hrs as needed.     Orders Placed This Encounter  Procedures  . DG KNEE 1-2 VIEWS BILAT  Meds ordered this encounter  Medications  . acetaminophen (TYLENOL) 500 MG tablet    Sig: Take 1 tablet (500 mg total) by mouth every 6 (six) hours as needed.    Dispense:  30 tablet    Refill:  3     Midori Dado M. Jimmey Ralph, MD [facility] Resident PGY-1 02/05/2015 11:27 AM

## 2015-02-05 NOTE — Assessment & Plan Note (Signed)
History of exam consistent with mild OA. No signs or symptoms of inflammatory process. Will confirm diagnosis with plain films. Will treat with tylenol  q6hrs as needed.

## 2015-02-05 NOTE — Addendum Note (Signed)
Addended by: Henri MedalHARTSELL, Saydi Kobel M on: 02/05/2015 12:08 PM   Modules accepted: Orders

## 2015-02-05 NOTE — Patient Instructions (Signed)
Thank you for coming to the clinic today. It was nice seeing you.  For your knee pain, you probably have arthritis. We will get xrays of your knees today to confirm this. For your pain, please take tylenol every 6 hours as needed.

## 2015-04-22 ENCOUNTER — Ambulatory Visit (HOSPITAL_COMMUNITY)
Admission: RE | Admit: 2015-04-22 | Discharge: 2015-04-22 | Disposition: A | Discharge status: Home / Self Care | Payer: No Typology Code available for payment source | Source: Ambulatory Visit | Attending: Internal Medicine | Admitting: Internal Medicine

## 2015-04-22 DIAGNOSIS — Z1231 Encounter for screening mammogram for malignant neoplasm of breast: Secondary | ICD-10-CM | POA: Insufficient documentation

## 2015-09-18 ENCOUNTER — Ambulatory Visit (INDEPENDENT_AMBULATORY_CARE_PROVIDER_SITE_OTHER): Payer: BLUE CROSS/BLUE SHIELD | Admitting: Family Medicine

## 2015-09-18 ENCOUNTER — Encounter: Payer: Self-pay | Admitting: Family Medicine

## 2015-09-18 VITALS — BP 126/66 | HR 87 | Temp 98.0°F | Ht 60.0 in | Wt 141.1 lb

## 2015-09-18 DIAGNOSIS — M25511 Pain in right shoulder: Secondary | ICD-10-CM | POA: Diagnosis not present

## 2015-09-18 DIAGNOSIS — Z0000 Encounter for general adult medical examination without abnormal findings: Secondary | ICD-10-CM

## 2015-09-18 DIAGNOSIS — M25519 Pain in unspecified shoulder: Secondary | ICD-10-CM | POA: Insufficient documentation

## 2015-09-18 MED ORDER — BACLOFEN 10 MG PO TABS: 5.0000 mg | ORAL_TABLET | Freq: Three times a day (TID) | ORAL | Status: DC | PRN

## 2015-09-18 NOTE — Patient Instructions (Signed)
Generic Shoulder Exercises  EXERCISES   RANGE OF MOTION (ROM) AND STRETCHING EXERCISES  These exercises may help you when beginning to rehabilitate your injury. Your symptoms may resolve with or without further involvement from your physician, physical therapist or athletic trainer. While completing these exercises, remember:   · Restoring tissue flexibility helps normal motion to return to the joints. This allows healthier, less painful movement and activity.  · An effective stretch should be held for at least 30 seconds.  · A stretch should never be painful. You should only feel a gentle lengthening or release in the stretched tissue.  ROM - Pendulum  · Bend at the waist so that your right / left arm falls away from your body. Support yourself with your opposite hand on a solid surface, such as a table or a countertop.  · Your right / left arm should be perpendicular to the ground. If it is not perpendicular, you need to lean over farther. Relax the muscles in your right / left arm and shoulder as much as possible.  · Gently sway your hips and trunk so they move your right / left arm without any use of your right / left shoulder muscles.  · Progress your movements so that your right / left arm moves side to side, then forward and backward, and finally, both clockwise and counterclockwise.  · Complete __________ repetitions in each direction. Many people use this exercise to relieve discomfort in their shoulder as well as to gain range of motion.  Repeat __________ times. Complete this exercise __________ times per day.  STRETCH - Flexion, Standing  · Stand with good posture. With an underhand grip on your right / left hand and an overhand grip on the opposite hand, grasp a broomstick or cane so that your hands are a little more than shoulder-width apart.  · Keeping your right / left elbow straight and shoulder muscles relaxed, push the stick with your opposite hand to raise your right / left arm in front of your  body and then overhead. Raise your arm until you feel a stretch in your right / left shoulder, but before you have increased shoulder pain.  · Try to avoid shrugging your right / left shoulder as your arm rises by keeping your shoulder blade tucked down and toward your mid-back spine. Hold __________ seconds.  · Slowly return to the starting position.  Repeat __________ times. Complete this exercise __________ times per day.  STRETCH - Internal Rotation  · Place your right / left hand behind your back, palm-up.  · Throw a towel or belt over your opposite shoulder. Grasp the towel/belt with your right / left hand.  · While keeping an upright posture, gently pull up on the towel/belt until you feel a stretch in the front of your right / left shoulder.  · Avoid shrugging your right / left shoulder as your arm rises by keeping your shoulder blade tucked down and toward your mid-back spine.  · Hold __________. Release the stretch by lowering your opposite hand.  Repeat __________ times. Complete this exercise __________ times per day.  STRETCH - External Rotation and Abduction  · Stagger your stance through a doorframe. It does not matter which foot is forward.  · As instructed by your physician, physical therapist or athletic trainer, place your hands:    And forearms above your head and on the door frame.    And forearms at head-height and on the door frame.      At elbow-height and on the door frame.  · Keeping your head and chest upright and your stomach muscles tight to prevent over-extending your low-back, slowly shift your weight onto your front foot until you feel a stretch across your chest and/or in the front of your shoulders.  · Hold __________ seconds. Shift your weight to your back foot to release the stretch.  Repeat __________ times. Complete this stretch __________ times per day.   STRENGTHENING EXERCISES   These exercises may help you when beginning to rehabilitate your injury. They may resolve your  symptoms with or without further involvement from your physician, physical therapist or athletic trainer. While completing these exercises, remember:   · Muscles can gain both the endurance and the strength needed for everyday activities through controlled exercises.  · Complete these exercises as instructed by your physician, physical therapist or athletic trainer. Progress the resistance and repetitions only as guided.  · You may experience muscle soreness or fatigue, but the pain or discomfort you are trying to eliminate should never worsen during these exercises. If this pain does worsen, stop and make certain you are following the directions exactly. If the pain is still present after adjustments, discontinue the exercise until you can discuss the trouble with your clinician.  · If advised by your physician, during your recovery, avoid activity or exercises which involve actions that place your right / left hand or elbow above your head or behind your back or head. These positions stress the tissues which are trying to heal.  STRENGTH - Scapular Depression and Adduction  · With good posture, sit on a firm chair. Supported your arms in front of you with pillows, arm rests or a table top. Have your elbows in line with the sides of your body.  · Gently draw your shoulder blades down and toward your mid-back spine. Gradually increase the tension without tensing the muscles along the top of your shoulders and the back of your neck.  · Hold for __________ seconds. Slowly release the tension and relax your muscles completely before completing the next repetition.  · After you have practiced this exercise, remove the arm support and complete it in standing as well as sitting.  Repeat __________ times. Complete this exercise __________ times per day.   STRENGTH - External Rotators  · Secure a rubber exercise band/tubing to a fixed object so that it is at the same height as your right / left elbow when you are standing  or sitting on a firm surface.  · Stand or sit so that the secured exercise band/tubing is at your side that is not injured.  · Bend your elbow 90 degrees. Place a folded towel or small pillow under your right / left arm so that your elbow is a few inches away from your side.  · Keeping the tension on the exercise band/tubing, pull it away from your body, as if pivoting on your elbow. Be sure to keep your body steady so that the movement is only coming from your shoulder rotating.  · Hold __________ seconds. Release the tension in a controlled manner as you return to the starting position.  Repeat __________ times. Complete this exercise __________ times per day.   STRENGTH - Supraspinatus  · Stand or sit with good posture. Grasp a __________ weight or an exercise band/tubing so that your hand is "thumbs-up," like when you shake hands.  · Slowly lift your right / left hand from your thigh into the air,   traveling about 30 degrees from straight out at your side. Lift your hand to shoulder height or as far as you can without increasing any shoulder pain. Initially, many people do not lift their hands above shoulder height.  · Avoid shrugging your right / left shoulder as your arm rises by keeping your shoulder blade tucked down and toward your mid-back spine.  · Hold for __________ seconds. Control the descent of your hand as you slowly return to your starting position.  Repeat __________ times. Complete this exercise __________ times per day.   STRENGTH - Shoulder Extensors  · Secure a rubber exercise band/tubing so that it is at the height of your shoulders when you are either standing or sitting on a firm arm-less chair.  · With a thumbs-up grip, grasp an end of the band/tubing in each hand. Straighten your elbows and lift your hands straight in front of you at shoulder height. Step back away from the secured end of band/tubing until it becomes tense.  · Squeezing your shoulder blades together, pull your hands down  to the sides of your thighs. Do not allow your hands to go behind you.  · Hold for __________ seconds. Slowly ease the tension on the band/tubing as you reverse the directions and return to the starting position.  Repeat __________ times. Complete this exercise __________ times per day.   STRENGTH - Scapular Retractors  · Secure a rubber exercise band/tubing so that it is at the height of your shoulders when you are either standing or sitting on a firm arm-less chair.  · With a palm-down grip, grasp an end of the band/tubing in each hand. Straighten your elbows and lift your hands straight in front of you at shoulder height. Step back away from the secured end of band/tubing until it becomes tense.  · Squeezing your shoulder blades together, draw your elbows back as you bend them. Keep your upper arm lifted away from your body throughout the exercise.  · Hold __________ seconds. Slowly ease the tension on the band/tubing as you reverse the directions and return to the starting position.  Repeat __________ times. Complete this exercise __________ times per day.  STRENGTH - Scapular Depressors  · Find a sturdy chair without wheels, such as a from a dining room table.  · Keeping your feet on the floor, lift your bottom from the seat and lock your elbows.  · Keeping your elbows straight, allow gravity to pull your body weight down. Your shoulders will rise toward your ears.  · Raise your body against gravity by drawing your shoulder blades down your back, shortening the distance between your shoulders and ears. Although your feet should always maintain contact with the floor, your feet should progressively support less body weight as you get stronger.  · Hold __________ seconds. In a controlled and slow manner, lower your body weight to begin the next repetition.  Repeat __________ times. Complete this exercise __________ times per day.      This information is not intended to replace advice given to you by your health  care provider. Make sure you discuss any questions you have with your health care provider.     Document Released: 06/10/2005 Document Revised: 08/17/2014 Document Reviewed: 11/08/2008  Elsevier Interactive Patient Education ©2016 Elsevier Inc.

## 2015-09-18 NOTE — Progress Notes (Signed)
Patient ID: Theresa Diaz, female   DOB: 1966/08/02, 50 y.o.   MRN: [id number]   Subjective:  This history was provided by the patient.  Theresa Diaz is a 50 y.o. female who  has a past medical history of Cholelithiasis (10/2014).. Patient presents to clinic for annual check-up.  Patient also mentioned upper paraspinal pain that has been intermittent for 10 years.  Patient had her gall bladder removed last year and states the pain was gone until last month.  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  Patient denies upper extremity weakness and numbness/tingling along spine, neck or arms.  She also denies trauma to area.  Patient denies abdominal pain, chest pain, dizziness, changes in vision, weakness, shortness of breath, N/V/D, fever, incontinence and changes in bowel/bladder habits.  Aside from back pain, patient has no other complaints.  Review of Systems:  Per HPI. All other systems reviewed and are negative.   PMH, PSH, Medications, Allergies, and FmHx reviewed and updated in EMR.  Social History: never smoker  Objective:  BP 126/66 mmHg  Pulse 87  Temp(Src) 98 F (36.7 C) (Oral)  Ht 5' (1.524 m)  Wt 141 lb 1.6 oz (64.003 kg)  BMI 27.56 kg/m2  LMP 07/30/2014 (Approximate)  General: 50 y.o. female Awake, alert, well- nourished, NAD and non-toxic in appearance HEENT: Normal    Neck: No masses palpated. No LAD     Eyes: Sclera white with no injection.  No drainage noted.    Nose: nasal turbinates moist    Throat: MMM, no erythema Cardio: RRR, S1S2 heard, no murmurs appreciated, no lower extremity edema, cyanosis or clubbing; +2 radial and dorsalis pedis pulses bilaterally Pulm: Clear to auscultation bilaterally, no wheezes, rhonchi or rales GI: Soft, not tender, no distension,+BS x4, no hepatomegaly, no splenomegaly Extremities: MAE Skin: dry, intact, no rashes or lesions Msk: Pain with palpation over 3/4 thoracic  vertebrae and right scapula.  No swelling or deformity noted.  No pain to palpation of cervical or lumbar spine. Neuro: Strength and light touch sensation grossly intact, upper and lower extremity strength equal bilaterally  Assessment & Plan:  Theresa Diaz is a 50 y.o. female here for an annual check-up and complains of occasional upper back pain.  Patient is well appearing and denies complaints except the back pain.  PE is unremarkable.  Back pain is reproducible with palpation and occurs most often after she has been repetitively using her upper extremities; most likely the pain is related to paraspinal muscle spasms.  1.  Upper back/shoulder pain - Continue Advil for pain as recommended on bottle - Handout with stretching exercises for shoulder/upper back given to patient   2.  Wellness Exam - Schedule annual exam for next year  Nelly Rout, NP [facility] 09/18/2015 3:05 PM

## 2015-12-09 ENCOUNTER — Ambulatory Visit: Payer: BLUE CROSS/BLUE SHIELD | Admitting: Family Medicine

## 2015-12-23 ENCOUNTER — Ambulatory Visit (INDEPENDENT_AMBULATORY_CARE_PROVIDER_SITE_OTHER): Payer: BLUE CROSS/BLUE SHIELD | Admitting: Family Medicine

## 2015-12-23 VITALS — BP 123/70 | HR 65 | Temp 97.8°F | Wt 145.0 lb

## 2015-12-23 DIAGNOSIS — R42 Dizziness and giddiness: Secondary | ICD-10-CM | POA: Diagnosis not present

## 2015-12-23 DIAGNOSIS — Z0000 Encounter for general adult medical examination without abnormal findings: Secondary | ICD-10-CM | POA: Diagnosis not present

## 2015-12-23 DIAGNOSIS — N951 Menopausal and female climacteric states: Secondary | ICD-10-CM

## 2015-12-23 LAB — CBC
HCT: 44.8 % (ref 35.0–45.0)
Hemoglobin: 15 g/dL (ref 11.7–15.5)
MCH: 28.8 pg (ref 27.0–33.0)
MCHC: 33.5 g/dL (ref 32.0–36.0)
MCV: 86 fL (ref 80.0–100.0)
MPV: 9.1 fL (ref 7.5–12.5)
PLATELETS: 356 10*3/uL (ref 140–400)
RBC: 5.21 MIL/uL — ABNORMAL HIGH (ref 3.80–5.10)
RDW: 14.9 % (ref 11.0–15.0)
WBC: 6.1 10*3/uL (ref 3.8–10.8)

## 2015-12-23 LAB — LIPID PANEL
Cholesterol: 246 mg/dL — ABNORMAL HIGH (ref 125–200)
HDL: 45 mg/dL — ABNORMAL LOW (ref >=46)
LDL Cholesterol: 150 mg/dL — ABNORMAL HIGH (ref <130)
TRIGLYCERIDES: 254 mg/dL — AB (ref <150)
Total CHOL/HDL Ratio: 5.5 Ratio — ABNORMAL HIGH (ref <=5.0)
VLDL: 51 mg/dL — ABNORMAL HIGH (ref <30)

## 2015-12-23 LAB — FOLLICLE STIMULATING HORMONE: FSH: 53.5 m[IU]/mL

## 2015-12-23 LAB — COMPLETE METABOLIC PANEL WITH GFR
ALBUMIN: 4.7 g/dL (ref 3.6–5.1)
ALK PHOS: 63 U/L (ref 33–115)
ALT: 55 U/L — ABNORMAL HIGH (ref 6–29)
AST: 36 U/L — AB (ref 10–35)
BILIRUBIN TOTAL: 0.5 mg/dL (ref 0.2–1.2)
BUN: 12 mg/dL (ref 7–25)
CO2: 26 mmol/L (ref 20–31)
Calcium: 9.6 mg/dL (ref 8.6–10.2)
Chloride: 102 mmol/L (ref 98–110)
Creat: 0.56 mg/dL (ref 0.50–1.10)
GFR, Est African American: >89 mL/min (ref >=60)
GLUCOSE: 102 mg/dL — AB (ref 65–99)
Potassium: 4.4 mmol/L (ref 3.5–5.3)
SODIUM: 138 mmol/L (ref 135–146)
TOTAL PROTEIN: 7.6 g/dL (ref 6.1–8.1)

## 2015-12-23 LAB — TSH: TSH: 0.95 mIU/L

## 2015-12-23 NOTE — Patient Instructions (Signed)
Chng m?t  (Dizziness)  Chng m?t l m?t v?n ?? ph? bi?n. ? l c?m gic khng ?n ??nh ho?c chong vng. Qu v? c th? c?m th?y nh? s?p ng?t. Chng m?t c th? d?n ??n ch?n th??ng n?u qu v? v?p ho?c t ng. B?t k? ai c?ng c th? b? chng m?t nh?ng chng m?t ph? bi?n h?n ? ng??i l?n. Tnh tr?ng ny c th? do m?t s? nguyn nhn, bao g?m dng thu?c, b? m?t n??c, ho?c b? ?m.  H??NG D?N CH?M SC T?I NH  Th?c hi?n nh?ng b??c sau c th? gip gi?m tnh tr?ng ny.  ?n v u?ng   U?ng ?? n??c ?? gi? cho n??c ti?u trong ho?c c mu vng nh?t. Vi?c ny gi? cho c? th? qu v? khng b? m?t n??c. C? g?ng u?ng nhi?u dung d?ch trong su?t, ch?ng h?n nh? n??c.   Khng u?ng r??u.   H?n ch? dng caffein n?u c ch? d?n c?a chuyn gia ch?m sc s?c kh?e.   H?n ch? dng mu?i n?u c ch? d?n c?a chuyn gia ch?m sc s?c kh?e.  Ho?t ??ng   Trnh cc ??ng tc nhanh.    T? ??ng d?y th?t ch?m v v?ng ch?c cho ??n khi qu v? c?m th?y khng c v?n ?? g.    Vo bu?i sng, ??u tin l ng?i d?y ? bn c?nh gi??ng. Khi qu v? c?m th?y ?n ? t? th? ny, hy ??ng d?y ch?m d?y trong khi n?m l?y m?t ci g ? cho ??n khi qu v? th?y th?ng b?ng.   C? ??ng chn th??ng xuyn n?u qu v? c?n ??ng ? m?t n?i trong m?t th?i gian di. Co v th? gin cc c? b?p ? chn qu v? khi ??ng.   Khng li xe ho?c v?n hnh my mc n?ng n?u qu v? c?m th?y chng m?t.   Trnh ci ng??i n?u qu v? c?m th?y chng m?t. ?? cc v?t d?ng trong nh sao cho c th? d? dng v?i t?i chng m khng c?n ci ng??i.  L?i s?ng   Khng s? d?ng b?t c? cc s?n ph?m thu?c l no, bao g?m thu?c l d?ng ht, thu?c l d?ng nhai ho?c thu?c l ?i?n t?. N?u qu v? c?n gip ?? ?? cai thu?c, hy h?i chuyn gia ch?m sc s?c kh?e.   C? g?ng gi?m c?ng th?ng, ch?ng h?n nh? t?p yoga ho?c thi?n. Ni v?i chuyn gia ch?m sc s?c kh?e n?u qu v? c?n gip ??.  H??ng d?n chung   Theo di tnh tr?ng chng m?t c?a qu v? ?? pht hi?n b?t k? thay ??i no.   Ch? s? d?ng thu?c theo ch? d?n c?a chuyn gia ch?m  sc s?c kh?e. Ni chuy?n v?i chuyn gia ch?m sc s?c kh?e n?u qu v? ngh? r?ng tnh tr?ng chng m?t l do vi?c qu v? dng thu?c gy ra.   Ni v?i m?t ng??i b?n ho?c m?t ng??i trong gia ?nh r?ng qu v? ?ang b? chng m?t. N?u h? nh?n th?y qu v? c thay ??i hnh vi, hy b?o h? g?i cho chuyn gia ch?m sc s?c kh?e.   Tun th? t?t c? cc cu?c h?n khm l?i theo ch? d?n c?a chuyn gia ch?m sc s?c kh?e. ?i?u ny c vai tr quan tr?ng.  ?I KHM N?U:   Tnh tr?ng chng m?t khng m?t ?i.   Qu v? b? hoa m?t ho?c chng m?t n?ng h?n.   Qu v? c?m th?y bu?n nn.   Qu v?   T?C ?I KHM N?U:  Qu v? nn ho?c tiu ch?y v khng th? ?n ho?c u?ng ???c th? g.  Qu v? g?p kh kh?n trong vi?c ni, ?i b?, nu?t ho?c s? d?ng cnh tay, bn tay ho?c chn.  Qu v? c?m th?y y?u ton thn.  Qu v? suy ngh? khng r rng ho?c kh ??t cu. M?t ng??i b?n ho?c ng??i trong gia ?nh c th? nh?n th?y ?i?u ny.  Qu v? b? ?au ng?c, ?au b?ng, kh th? ho?c v m? hi.  Th? l?c c?a qu v? thay ??i.  Qu v? th?y b?t k? ch? ch?y mu no.  Qu v? b? ?au ??u.  Qu v? b? ?au c? ho?c c? c?ng.  Qu v? b? s?t.   Thng tin ny khng nh?m m?c ?ch thay th? cho l?i khuyn m chuyn gia ch?m [place] s?c kh?e ni v?i qu v?. Hy b?o ??m qu v? ph?i th?o lu?n b?t k? v?n ?? g m qu v? c v?i chuyn gia ch?m [place] s?c kh?e c?a qu v?.   Document Released: 07/16/2011 Document Revised: 12/11/2014 Elsevier Interactive Patient Education [employer]2016 Elsevier Inc.

## 2015-12-26 NOTE — Assessment & Plan Note (Signed)
Last period 4 months ago, have been very irregular for the past few years - [facility] - will consider endometrial evaluation if any further bleeding occurs - no interest in treatment for mild menopausal symptoms

## 2015-12-26 NOTE — Assessment & Plan Note (Signed)
Occasional dizziness/lightheadedness when bending over, sounds orthostatic. Pt concerned it may be related to menopause - check cbc, cmp, tsh - rec increased water intake - f/u in 2 weeks

## 2015-12-26 NOTE — Assessment & Plan Note (Signed)
Check lipid panel Otherwise UTD on health maintenance

## 2015-12-26 NOTE — Progress Notes (Signed)
   Subjective:   Theresa Diaz is a 50 y.o. female with a history of OA here for dizziness and concerns about menopause  DIZZINESS  Feeling dizzy off and on for several months, about once a week Dizziness is when she bends over Feels like room spins: no Lightheadedness when stands: no Palpitations or heart racing: no Prior dizziness: no Medications tried: none Taking blood thinners: no  Symptoms Hearing Loss: no Ear Pain or fullness: no Nausea or vomiting: no Vision difficulty or double vision: darkening vision when dizzy but not otherwise Falls: no Head trauma: no Weakness in arm or leg: no Speaking problems: no Headache: no  ROS see HPI Smoking Status noted  Objective:  BP 123/70 mmHg  Pulse 65  Temp(Src) 97.8 F (36.6 C) (Oral)  Wt 145 lb (65.772 kg)  LMP 07/30/2014 (Approximate)  Gen:  50 y.o. female in NAD HEENT: NCAT, MMM, EOMI, PERRL, anicteric sclerae CV: RRR, no MRG, no JVD Resp: Non-labored, CTAB, no wheezes noted Abd: Soft, NTND, BS present, no guarding or organomegaly Ext: WWP, no edema MSK: Full ROM, strength intact Neuro: Alert and oriented, speech normal, normal strength and sensation throughout, CN II-XII intact      Chemistry      Component Value Date/Time   NA 138 12/23/2015 1025   K 4.4 12/23/2015 1025   CL 102 12/23/2015 1025   CO2 26 12/23/2015 1025   BUN 12 12/23/2015 1025   CREATININE 0.56 12/23/2015 1025   CREATININE 0.57 10/29/2014 1324      Component Value Date/Time   CALCIUM 9.6 12/23/2015 1025   ALKPHOS 63 12/23/2015 1025   AST 36* 12/23/2015 1025   ALT 55* 12/23/2015 1025   BILITOT 0.5 12/23/2015 1025      Lab Results  Component Value Date   WBC 6.1 12/23/2015   HGB 15.0 12/23/2015   HCT 44.8 12/23/2015   MCV 86.0 12/23/2015   PLT 356 12/23/2015   Lab Results  Component Value Date   TSH 0.95 12/23/2015   No results found for: HGBA1C Assessment & Plan:     Theresa Diaz is a 50 y.o. female here for  dizziness  Dizziness Occasional dizziness/lightheadedness when bending over, sounds orthostatic. Pt concerned it may be related to menopause - check cbc, cmp, tsh - rec increased water intake - f/u in 2 weeks  Perimenopausal Last period 4 months ago, have been very irregular for the past few years - [facility] - will consider endometrial evaluation if any further bleeding occurs - no interest in treatment for mild menopausal symptoms   Preventative health care Check lipid panel Otherwise UTD on health maintenance    Beverely LowElena Mikhayla Phillis, MD, MPH [facility] 12/26/2015 2:20 PM

## 2016-01-02 ENCOUNTER — Telehealth: Payer: Self-pay | Admitting: *Deleted

## 2016-01-02 NOTE — Telephone Encounter (Signed)
-----   Message from Abram SanderElena M Adamo, MD sent at 01/01/2016 12:43 PM EDT ----- Please inform patient [facility] is probably going through menopause at this point. Cholesterol is high but not high enough to require medication. Otherwise labs look good. F/u in 2 weeks if dizziness not improving.

## 2016-01-02 NOTE — Telephone Encounter (Signed)
Patient informed, expressed understanding. 

## 2016-01-13 ENCOUNTER — Ambulatory Visit (INDEPENDENT_AMBULATORY_CARE_PROVIDER_SITE_OTHER): Payer: BLUE CROSS/BLUE SHIELD | Admitting: Family Medicine

## 2016-01-13 ENCOUNTER — Encounter: Payer: Self-pay | Admitting: Family Medicine

## 2016-01-13 VITALS — BP 138/80 | HR 80 | Temp 97.9°F | Ht 60.0 in | Wt 149.0 lb

## 2016-01-13 DIAGNOSIS — G5603 Carpal tunnel syndrome, bilateral upper limbs: Secondary | ICD-10-CM

## 2016-01-13 DIAGNOSIS — G5600 Carpal tunnel syndrome, unspecified upper limb: Secondary | ICD-10-CM

## 2016-01-13 HISTORY — DX: Carpal tunnel syndrome, unspecified upper limb: G56.00

## 2016-01-13 NOTE — Patient Instructions (Signed)
H?i ch?ng ?ng c? tay (Carpal Tunnel Syndrome) H?i ch?ng ?ng c? tay l m?t b?nh gy ?au ? bn tay v cnh tay qu v?. ?ng c? tay l m?t vng h?p n?m ? bn pha cnh tay c?a c? tay qu v?. Ho?t ??ng c? tay l?p ?i l?p l?i ho?c m?t s? b?nh nh?t ??nh c th? gy s?ng trong ?ng c? tay. Tnh tr?ng s?ng b ch?t dy th?n kinh chnh ? c? tay (giy th?n kinh gi?a). NGUYN NHN  Tnh tr?ng ny c th? do:   C? ??ng c? tay l?p ?i l?p l?i.  T?n th??ng c? tay.  Vim kh?p.  U nang ho?c kh?i u ? ?ng c? tay.  Tch t? ch?t d?ch trong lc mang thai. ?i khi khng r nguyn nhn gy ra tnh tr?ng ny.  CC Y?U T? NGUY C? Tnh tr?ng ny hay x?y ra h?n ?:   Nh?ng ng??i c cng vi?c ?i h?i h? ph?i c? ??ng c? tay lin t?c cng m?t ??ng tc, ch?ng h?n nh? ng??i bn th?t v nhn vin thu ngn.  Ph? n?.  Nh?ng ng??i c m?t s? b?nh nh?t ??nh, ch?ng h?n nh?:  Ti?u ???ng.  Bo ph.  Tuy?n gip ho?t ??ng km (nh??c gip).  Suy th?n. TRI?U CH?NG  Nh?ng tri?u ch?ng c?a tnh tr?ng ny bao g?m:   C?m gic t bu?t ? cc ngn tay, ??c bi?t l ngn ci, ngn tr? v ngn gi?a.  T bu?t ho?c t b ? bn tay.  M?t c?m gic ?au ? ton b? cnh tay, ??c bi?t l khi c? tay qu v? b? g?p trong m?t th?i gian di.  ?au c? tay lan ln cnh tay ??n vai.  C?n ?au khng lan xu?ng lng bn tay ho?c ngn tay.  C?m gic y?u ? tay. Qu v? kh c?m ho?c gi? ?? v?t. Tri?u ch?ng c?a qu v? c th? c?m th?y n?ng h?n vo ban ?m.  CH?N ?ON  Tnh tr?ng ny c th? ???c ch?n ?on d?a vo khai thc b?nh s? v khm th?c th?[place]. Qu v? c?ng c th? ph?i lm cc ki?m tra, bao g?m:   ?i?n c? ?? (EMG). Ki?m tra ny ?o cc tn hi?u ?i?n t? dy th?n kinh g?i ??n c? c?a qu v?.  Ch?p X quang. ?I?U TR?  ?i?u tr? b?nh ny bao g?m:  Thay ??i l?i s?ng. ?i?u quan tr?ng l ph?i d?ng th?c hi?n ho?c ?i?u ch?nh ho?t ??ng gy ra tnh tr?ng b?nh.  [place]?u php v?t l ho?c ngh? nghi?p.  Thu?c ?? gi?m ?au v gi?m vim. Thu?c c th? bao g?m thu?c tim  vo c? tay.  N?p c? tay.  Ph?u thu?t. H??NG D?N CH?M [place] T?I NH  N?u qu v? s? d?ng n?p:  Hy ?eo n?p theo ch? d?n c?a chuyn gia ch?m [place] s?c kh?e. Ch? tho ra theo ch? d?n c?a chuyn gia ch?m [place] s?c kh?e.  N?i l?ng n?p n?u cc ngn tay b? t v ?au bu?t, ho?c n?u ngn tay b? l?nh v c mu xanh.  Gi? cho n?p s?ch s? v kh. H??ng d?n chung  Ch? s? d?ng thu?c khng c?n k ??n v thu?c c?n k ??n theo ch? d?n c?a chuyn gia ch?m [place] s?c kh?e.  Cho c? tay ngh? khng th?c hi?n b?t k? ??ng tc no c th? gy ?au. N?u tnh tr?ng c?a qu v? lin quan ??n cng vi?c, hy ni v?i ch? s? d?ng lao ??ng v? nh?ng thay ??i c  th? th?c hi?n, ch?ng h?n l?y m?t mi?ng ??m c? tay ?? s? d?ng khi ?nh my.  N?u ???c ch? d?n, hy ch??m ? l?nh vo vng b? ?au:  Cho ? l?nh vo ti nh?a.  ?? kh?n t?m ? gi?a da v ti ch??m.  Ch??m ? l?nh trong 20 pht, 2-3 l?n m?i ngy.  Tun th? t?t c? cc cu?c h?n khm l?i theo ch? d?n c?a chuyn gia ch?m [place] s?c kh?e. ?i?u ny c vai tr quan tr?ng.  T?p b?t k? bi t?p no theo ch? d?n c?a chuyn gia ch?m [place] s?c kh?e, chuyn gia v?t l tr? [place]?u ho?c chuyn gia v? b?nh ngh? nghi?p. ?I KHM N?U:   Qu v? c cc tri?u ch?ng m?i.  C?n ?au c?a qu v? khng ki?m sot ???c b?ng thu?c.  Tri?u ch?ng c?a qu v? tr?m tr?ng h?n.   Thng tin ny khng nh?m m?c ?ch thay th? cho l?i khuyn m chuyn gia ch?m [place] s?c kh?e ni v?i qu v?. Hy b?o ??m qu v? ph?i th?o lu?n b?t k? v?n ?? g m qu v? c v?i chuyn gia ch?m [place] s?c kh?e c?a qu v?.   Document Released: 07/27/2005 Document Revised: 04/17/2015 Elsevier Interactive Patient Education [employer]

## 2016-01-17 NOTE — Progress Notes (Signed)
   Subjective:   Theresa Diaz is a 50 y.o. female with a history of no chronic medical problems here for hand pain/numbness  EXTREMITY PAIN  Location: bilateral hands, left worse than right Pain started: years ago but worse the past few weeks Pain is: tingly/burning/numbness, radiates from wrist out to fingers Severity: moderate Medications tried: none Recent trauma: no Similar pain previously: no  Symptoms Redness:no Swelling:no Fever: no Weakness: no Weight loss: no Rash: no   Review of Symptoms - see HPI PMH - Smoking status noted.     Objective:  BP 138/80 mmHg  Pulse 80  Temp(Src) 97.9 F (36.6 C) (Oral)  Ht 5' (1.524 m)  Wt 149 lb (67.586 kg)  BMI 29.10 kg/m2  LMP 07/30/2014 (Approximate)  Gen:  50 y.o. female in NAD HEENT: NCAT, MMM, anicteric sclerae CV: RRR, no MRG Resp: Non-labored, CTAB, no wheezes noted Abd: Soft, NTND, BS present, no guarding or organomegaly Ext: WWP, no edema MSK: Full ROM, grip strength intact, negative tinnels Neuro: Alert and oriented, speech normal    Assessment & Plan:     Theresa Borrowancy Che Malstrom is a 50 y.o. female here for hand pain  Carpal tunnel syndrome Numbness and tingling in L>R hand, happening for years but getting worse, worse after work, painful at night, no neck pain, radiates wrist to fingers, no weakness - rx wrist splints, wear as much as possible - f/u in 1 month      Beverely LowElena Alvina Strother, MD, MPH [facility] PGY-3 01/17/2016 8:29 AM

## 2016-01-17 NOTE — Assessment & Plan Note (Addendum)
Numbness and tingling in L>R hand, happening for years but getting worse, worse after work, painful at night, no neck pain, radiates wrist to fingers, no weakness - rx wrist splints, wear as much as possible - f/u in 1 month

## 2016-02-18 ENCOUNTER — Other Ambulatory Visit: Payer: Self-pay | Admitting: Internal Medicine

## 2016-02-18 ENCOUNTER — Other Ambulatory Visit: Payer: Self-pay | Admitting: Physical Therapy

## 2016-02-18 DIAGNOSIS — Z1231 Encounter for screening mammogram for malignant neoplasm of breast: Secondary | ICD-10-CM

## 2016-03-12 IMAGING — CR DG ABDOMEN 2V
2 series · 2 of 2 positions shown · non-contrast
Comparison: None.

CLINICAL DATA: Epigastric pain and nausea and vomiting with ileus
emesis for the past 5 days ; suspect suspect bowel obstruction

EXAM:
ABDOMEN - 2 VIEW

[w abdomen upright]
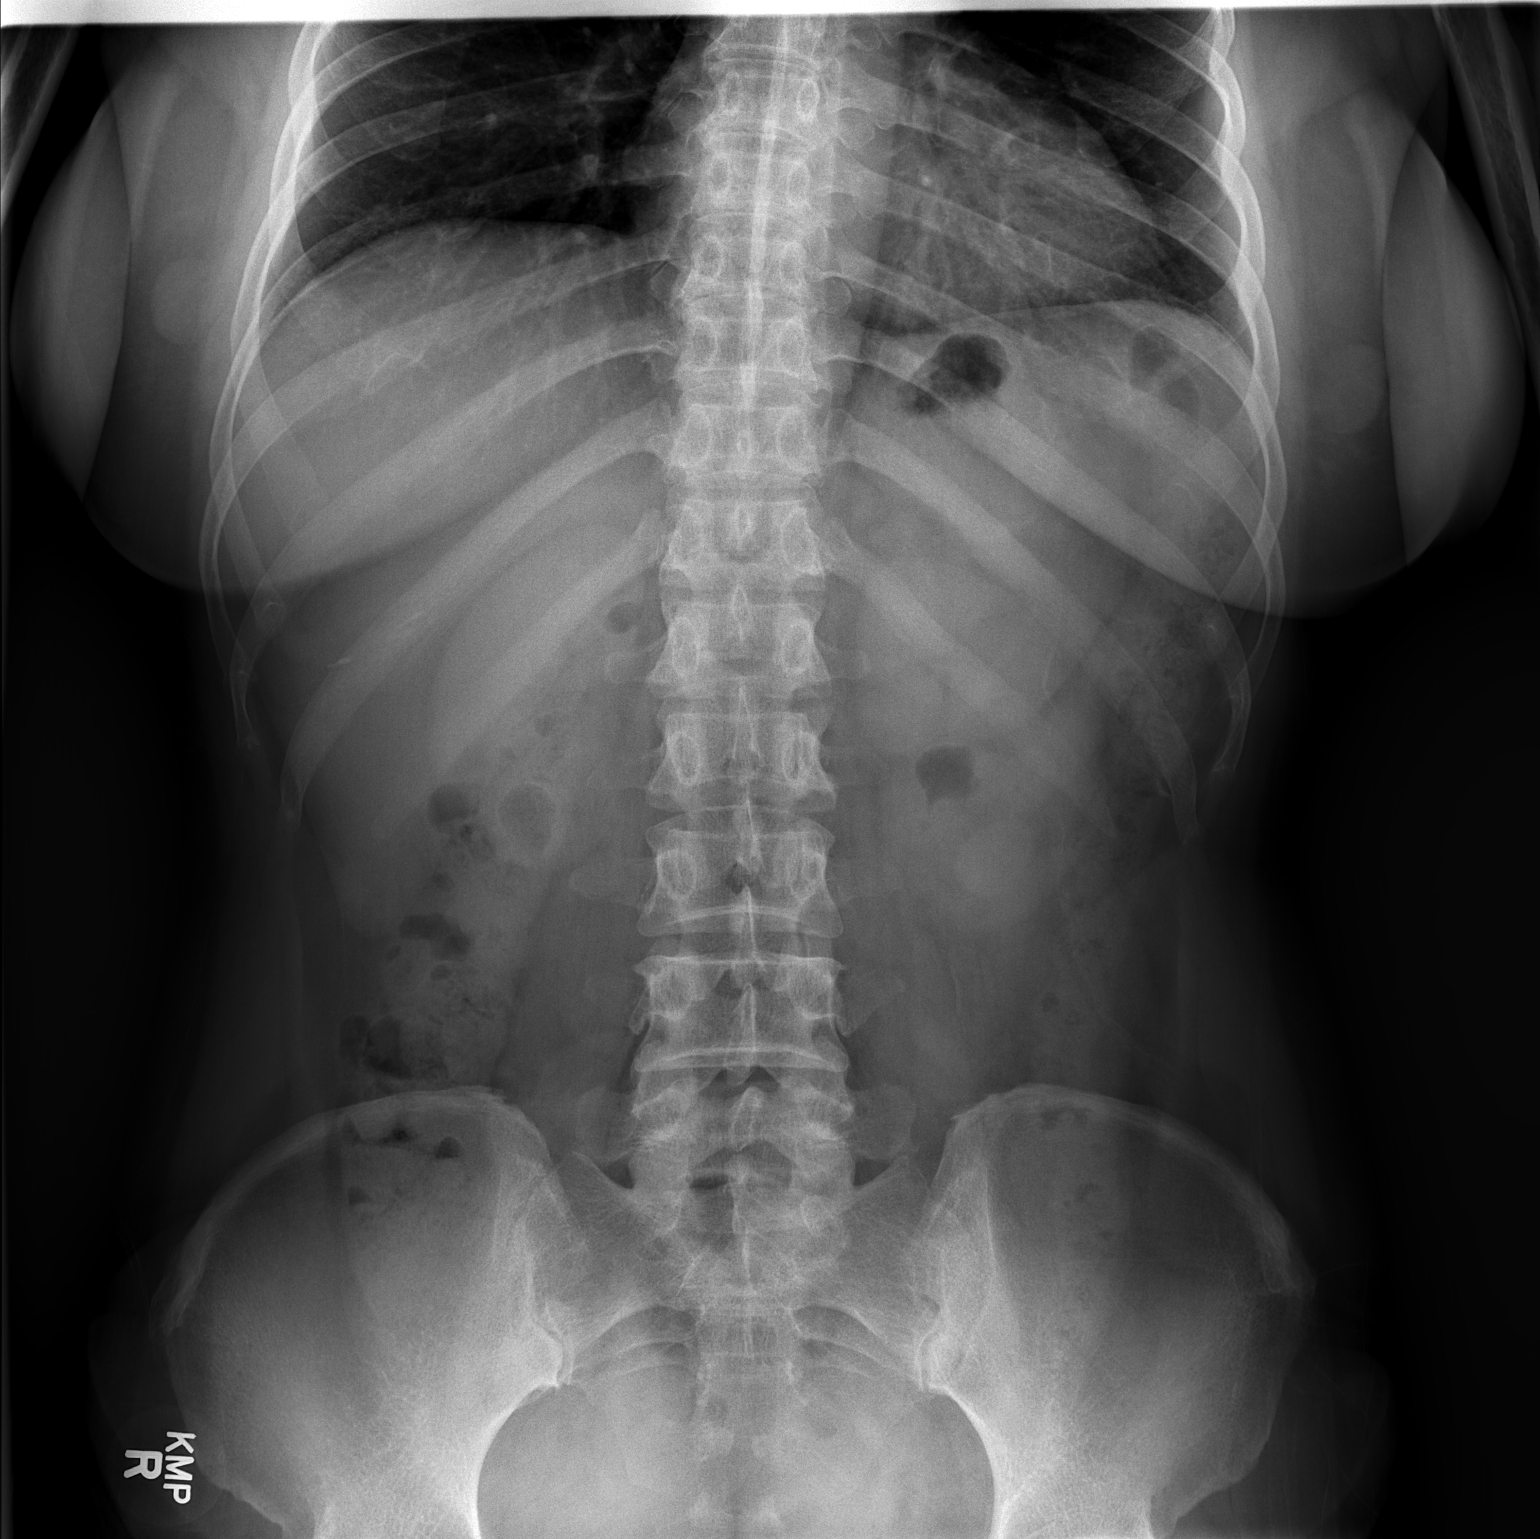

[t abdomen supine]
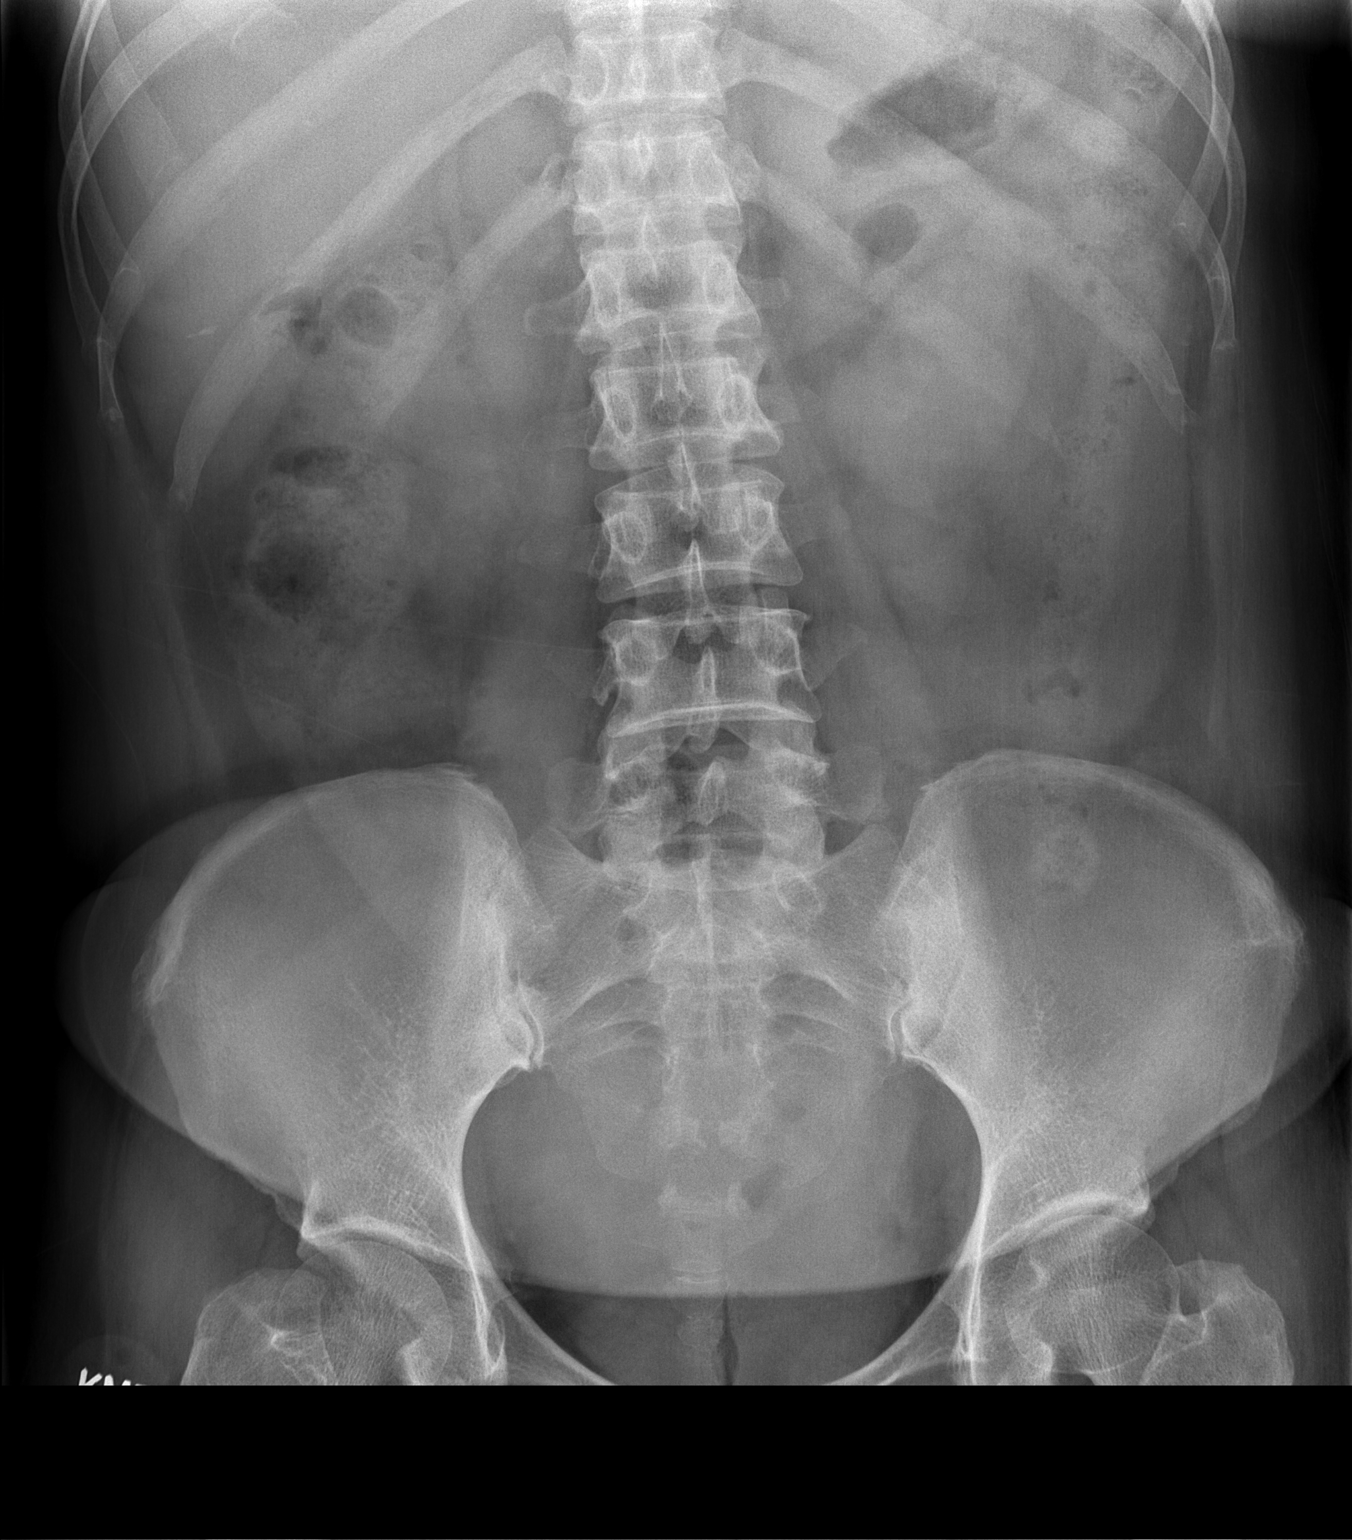

[2 of 2 positions shown; findings below may reference images not displayed]

FINDINGS: The small and large bowel gas patterns are normal. There is a small
amount of gas in the stomach. There are no free extraluminal gas
collections. There are no abnormal soft tissue calcifications. The
bony structures are unremarkable. The lung bases are clear.
IMPRESSION: There is no acute intra-abdominal abnormality demonstrated on this
two-view series.

## 2016-04-23 ENCOUNTER — Ambulatory Visit
Admission: RE | Admit: 2016-04-23 | Discharge: 2016-04-23 | Disposition: A | Discharge status: Home / Self Care | Payer: BLUE CROSS/BLUE SHIELD | Source: Ambulatory Visit | Attending: Internal Medicine | Admitting: Internal Medicine

## 2016-04-23 DIAGNOSIS — Z1231 Encounter for screening mammogram for malignant neoplasm of breast: Secondary | ICD-10-CM

## 2016-05-04 ENCOUNTER — Other Ambulatory Visit (HOSPITAL_COMMUNITY)
Admission: RE | Admit: 2016-05-04 | Discharge: 2016-05-04 | Disposition: A | Discharge status: Home / Self Care | Payer: BLUE CROSS/BLUE SHIELD | Source: Ambulatory Visit | Attending: Family Medicine | Admitting: Family Medicine

## 2016-05-04 ENCOUNTER — Encounter: Payer: Self-pay | Admitting: Internal Medicine

## 2016-05-04 ENCOUNTER — Ambulatory Visit (INDEPENDENT_AMBULATORY_CARE_PROVIDER_SITE_OTHER): Payer: BLUE CROSS/BLUE SHIELD | Admitting: Internal Medicine

## 2016-05-04 VITALS — BP 126/80 | HR 73 | Temp 97.6°F | Ht 60.0 in | Wt 149.0 lb

## 2016-05-04 DIAGNOSIS — R87618 Other abnormal cytological findings on specimens from cervix uteri: Secondary | ICD-10-CM

## 2016-05-04 DIAGNOSIS — R888 Abnormal findings in other body fluids and substances: Secondary | ICD-10-CM | POA: Diagnosis not present

## 2016-05-04 DIAGNOSIS — Z01411 Encounter for gynecological examination (general) (routine) with abnormal findings: Secondary | ICD-10-CM | POA: Diagnosis present

## 2016-05-04 DIAGNOSIS — Z23 Encounter for immunization: Secondary | ICD-10-CM

## 2016-05-04 DIAGNOSIS — R7309 Other abnormal glucose: Secondary | ICD-10-CM | POA: Diagnosis not present

## 2016-05-04 DIAGNOSIS — E559 Vitamin D deficiency, unspecified: Secondary | ICD-10-CM | POA: Diagnosis not present

## 2016-05-04 DIAGNOSIS — Z1151 Encounter for screening for human papillomavirus (HPV): Secondary | ICD-10-CM | POA: Diagnosis present

## 2016-05-04 DIAGNOSIS — G5603 Carpal tunnel syndrome, bilateral upper limbs: Secondary | ICD-10-CM

## 2016-05-04 DIAGNOSIS — B977 Papillomavirus as the cause of diseases classified elsewhere: Secondary | ICD-10-CM

## 2016-05-04 DIAGNOSIS — IMO0002 Reserved for concepts with insufficient information to code with codable children: Secondary | ICD-10-CM

## 2016-05-04 LAB — POCT GLYCOSYLATED HEMOGLOBIN (HGB A1C): Hemoglobin A1C: 6.7

## 2016-05-04 NOTE — Progress Notes (Signed)
[facility] Progress Note  Subjective:  Theresa Diaz is a 49-y/o female who presents for follow-up of hand numbness.  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 - Numbness worse in morning and at night; restricted to wrist and fingers - [facility] and wrist splints had been recommended; patient did not pick these up or recall being told to do so - Denies trauma to hand - Pain is mostly a pins and needles sensation - Had been told she has vitamin D deficiency in the past and is taking 2,000 IU daily and centrum daily vitamin - Does report dad had diabetes ROS: No extremity weakness, no other joints aches  Abnormal pap smear: - High risk HPV detected on last year's pap smear; repeat in 1 year recommended  No Known Allergies  Objective: Blood pressure 126/80, pulse 73, temperature 97.6 F (36.4 C), temperature source Oral, height 5' (1.524 m), weight 149 lb (67.6 kg), last menstrual period 07/30/2014. Body mass index is 29.1 kg/m. Constitutional: Well-appearing female in NAD Musculoskeletal: No hand atrophy Neurological: Hand grip intact bilaterally with 5/5 strength; positive Phalen's sign test with numbness in distribution of median nerve (more pronounced L > R) Skin: Exposed skin is warm and dry. No rash noted. No erythema.  Psychiatric: Normal mood and affect.  Vitals reviewed  Assessment/Plan: Carpal tunnel syndrome - Exam c/w carpal tunnel syndrome - Provided paper rx for wrist splints and recommended local DME supply store - Advised patient to wear wrist splints as much as possible over the next month, especially at night. If she has trouble getting splints, [facility] and we can refer to Sports Medicine for splint fitting. - If no improvement with splints, consider steroid wrist injections vs PO steroids - Given tingling, will also obtain  vitamin D level given reported history of deficiency to ensure current supplementation is sufficient. 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  Dani GobbleHillary Kemiyah Tarazon, MD [facility], PGY-2

## 2016-05-04 NOTE — Patient Instructions (Signed)
[name],  Please obtain splints for your left and right wrists. I have placed a prescription. Usually [facility] is a preferred location to pick this up ([address], [place], [place] [id number]) or Biomed. If you do not have improvement in 1 month, please make an appointment with us again or call for referral to Sports Medicine.  I will send a letter with your lab results.  Best, Dr. Sampson GoonFitzgerald   H?i ch?ng ?ng c? tay (Carpal Tunnel Syndrome) H?i ch?ng ?ng c? tay l m?t b?nh gy ?au ? bn tay v cnh tay qu v?. ?ng c? tay l m?t vng h?p n?m ? bn pha cnh tay c?a c? tay qu v?. Ho?t ??ng c? tay l?p ?i l?p l?i ho?c m?t s? b?nh nh?t ??nh c th? gy s?ng trong ?ng c? tay. Tnh tr?ng s?ng b ch?t dy th?n kinh chnh ? c? tay (giy th?n kinh gi?a). NGUYN NHN  Tnh tr?ng ny c th? do:   C? ??ng c? tay l?p ?i l?p l?i.  T?n th??ng c? tay.  Vim kh?p.  U nang ho?c kh?i u ? ?ng c? tay.  Tch t? ch?t d?ch trong lc mang thai. ?i khi khng r nguyn nhn gy ra tnh tr?ng ny.  CC Y?U T? NGUY C? Tnh tr?ng ny hay x?y ra h?n ?:   Nh?ng ng??i c cng vi?c ?i h?i h? ph?i c? ??ng c? tay lin t?c cng m?t ??ng tc, ch?ng h?n nh? ng??i bn th?t v nhn vin thu ngn.  Ph? n?.  Nh?ng ng??i c m?t s? b?nh nh?t ??nh, ch?ng h?n nh?:  Ti?u ???ng.  Bo ph.  Tuy?n gip ho?t ??ng km (nh??c gip).  Suy th?n. TRI?U CH?NG  Nh?ng tri?u ch?ng c?a tnh tr?ng ny bao g?m:   C?m gic t bu?t ? cc ngn tay, ??c bi?t l ngn ci, ngn tr? v ngn gi?a.  T bu?t ho?c t b ? bn tay.  M?t c?m gic ?au ? ton b? cnh tay, ??c bi?t l khi c? tay qu v? b? g?p trong m?t th?i gian di.  ?au c? tay lan ln cnh tay ??n vai.  C?n ?au khng lan xu?ng lng bn tay ho?c ngn tay.  C?m gic y?u ? tay. Qu v? kh c?m ho?c gi? ?? v?t. Tri?u ch?ng c?a qu v? c th? c?m th?y n?ng h?n vo ban ?m.  CH?N ?ON  Tnh tr?ng ny c th? ???c ch?n ?on d?a vo khai thc b?nh s? v khm th?c  th?Ladell Heads. Qu v? c?ng c th? ph?i lm cc ki?m tra, bao g?m:   ?i?n c? ?? (EMG). Ki?m tra ny ?o cc tn hi?u ?i?n t? dy th?n kinh g?i ??n c? c?a qu v?.  Ch?p X quang. ?I?U TR?  ?i?u tr? b?nh ny bao g?m:  Thay ??i l?i s?ng. ?i?u quan tr?ng l ph?i d?ng th?c hi?n ho?c ?i?u ch?nh ho?t ??ng gy ra tnh tr?ng b?nh.  [place]?u php v?t l ho?c ngh? nghi?p.  Thu?c ?? gi?m ?au v gi?m vim. Thu?c c th? bao g?m thu?c tim vo c? tay.  N?p c? tay.  Ph?u thu?t. H??NG D?N CH?M [place] T?I NH  N?u qu v? s? d?ng n?p:  Hy ?eo n?p theo ch? d?n c?a chuyn gia ch?m [place] s?c kh?e. Ch? tho ra theo ch? d?n c?a chuyn gia ch?m [place] s?c kh?e.  N?i l?ng n?p n?u cc ngn tay b? t v ?au bu?t, ho?c n?u ngn tay b? l?nh v c mu xanh.  Gi? cho n?p s?ch s? v kh. H??ng d?n chung  Ch? s? d?ng thu?c khng c?n k ??n v thu?c c?n k ??n theo ch? d?n c?a chuyn gia ch?m [place] s?c kh?e.  Cho c? tay ngh? khng th?c hi?n b?t k? ??ng tc no c th? gy ?au. N?u tnh tr?ng c?a qu v? lin quan ??n cng vi?c, hy ni v?i ch? s? d?ng lao ??ng v? nh?ng thay ??i c th? th?c hi?n, ch?ng h?n l?y m?t mi?ng ??m c? tay ?? s? d?ng khi ?nh my.  N?u ???c ch? d?n, hy ch??m ? l?nh vo vng b? ?au:  Cho ? l?nh vo ti nh?a.  ?? kh?n t?m ? gi?a da v ti ch??m.  Ch??m ? l?nh trong 20 pht, 2-3 l?n m?i ngy.  Tun th? t?t c? cc cu?c h?n khm l?i theo ch? d?n c?a chuyn gia ch?m [place] s?c kh?e. ?i?u ny c vai tr quan tr?ng.  T?p b?t k? bi t?p no theo ch? d?n c?a chuyn gia ch?m [place] s?c kh?e, chuyn gia v?t l tr? [place]?u ho?c chuyn gia v? b?nh ngh? nghi?p. ?I KHM N?U:   Qu v? c cc tri?u ch?ng m?i.  C?n ?au c?a qu v? khng ki?m sot ???c b?ng thu?c.  Tri?u ch?ng c?a qu v? tr?m tr?ng h?n.   Thng tin ny khng nh?m m?c ?ch thay th? cho l?i khuyn m chuyn gia ch?m [place] s?c kh?e ni v?i qu v?. Hy b?o ??m qu v? ph?i th?o lu?n b?t k? v?n ?? g m qu v? c v?i chuyn gia ch?m [place] s?c kh?e c?a qu v?.   Document  Released: 07/27/2005 Document Revised: 04/17/2015 Elsevier Interactive Patient Education [employer]

## 2016-05-05 DIAGNOSIS — R87619 Unspecified abnormal cytological findings in specimens from cervix uteri: Secondary | ICD-10-CM | POA: Insufficient documentation

## 2016-05-05 LAB — CYTOLOGY - PAP

## 2016-05-05 LAB — VITAMIN D 25 HYDROXY (VIT D DEFICIENCY, FRACTURES): VIT D 25 HYDROXY: 40 ng/mL (ref 30–100)

## 2016-05-05 NOTE — Assessment & Plan Note (Signed)
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

## 2016-05-05 NOTE — Assessment & Plan Note (Addendum)
-   Exam c/w carpal tunnel syndrome - Provided paper rx for wrist splints and recommended local DME supply store - Advised patient to wear wrist splints as much as possible over the next month, especially at night. If she has trouble getting splints, recommended [facility] and we can refer to Sports Medicine for splint fitting. - If no improvement with splints, consider steroid wrist injections vs PO steroids - Given tingling, will also obtain vitamin D level given reported history of deficiency to ensure current supplementation is sufficient. And will obtain hbb A1c as pt overweight and has family history diabetes and had mildly elevated glucose on last CMP this spring

## 2016-05-08 ENCOUNTER — Encounter: Payer: Self-pay | Admitting: Internal Medicine

## 2016-05-28 ENCOUNTER — Ambulatory Visit (INDEPENDENT_AMBULATORY_CARE_PROVIDER_SITE_OTHER): Payer: BLUE CROSS/BLUE SHIELD | Admitting: Internal Medicine

## 2016-05-28 ENCOUNTER — Encounter: Payer: Self-pay | Admitting: Internal Medicine

## 2016-05-28 VITALS — BP 113/67 | HR 78 | Temp 98.0°F | Ht 60.0 in | Wt 149.8 lb

## 2016-05-28 DIAGNOSIS — R740 Nonspecific elevation of levels of transaminase and lactic acid dehydrogenase [LDH]: Secondary | ICD-10-CM | POA: Diagnosis not present

## 2016-05-28 DIAGNOSIS — R7401 Elevation of levels of liver transaminase levels: Secondary | ICD-10-CM

## 2016-05-28 DIAGNOSIS — E119 Type 2 diabetes mellitus without complications: Secondary | ICD-10-CM

## 2016-05-28 LAB — COMPLETE METABOLIC PANEL WITH GFR
ALT: 31 U/L — AB (ref 6–29)
AST: 25 U/L (ref 10–35)
Albumin: 4.2 g/dL (ref 3.6–5.1)
Alkaline Phosphatase: 57 U/L (ref 33–115)
BILIRUBIN TOTAL: 0.4 mg/dL (ref 0.2–1.2)
BUN: 11 mg/dL (ref 7–25)
CHLORIDE: 105 mmol/L (ref 98–110)
CO2: 27 mmol/L (ref 20–31)
CREATININE: 0.65 mg/dL (ref 0.50–1.10)
Calcium: 9.2 mg/dL (ref 8.6–10.2)
GFR, Est African American: >89 mL/min (ref >=60)
GFR, Est Non African American: >89 mL/min (ref >=60)
GLUCOSE: 106 mg/dL — AB (ref 65–99)
Potassium: 4.1 mmol/L (ref 3.5–5.3)
SODIUM: 140 mmol/L (ref 135–146)
TOTAL PROTEIN: 6.9 g/dL (ref 6.1–8.1)

## 2016-05-28 MED ORDER — METFORMIN HCL 500 MG PO TABS: 500.0000 mg | ORAL_TABLET | Freq: Every day | ORAL | 3 refills | Status: DC

## 2016-05-28 NOTE — Patient Instructions (Addendum)
[name],  Thank you for coming in today.  Your last blood sugar check was in the range of diabetes. I recommend starting metformin 500 mg daily with breakfast for a week. If you do not have side effects (diarrhea, upset stomach), increase the dose to 500 mg twice daily. With weight loss and changes in diet, you may not need to be on medication long-term.   Please make an appointment to follow-up blood sugars at the end of December, early January.   With high blood sugars, it is important to have a yearly eye exam. I have provided a list of eye doctors in the area.  I also recommend meeting with our dietician-- she specializes in taking about ways to improve diet to stay healthy.  Best, Dr. Sampson GoonFitzgerald    Diabetes Mellitus and Food It is important for you to manage your blood sugar (glucose) level. Your blood glucose level can be greatly affected by what you eat. Eating healthier foods in the appropriate amounts throughout the day at about the same time each day will help you control your blood glucose level. It can also help slow or prevent worsening of your diabetes mellitus. Healthy eating may even help you improve the level of your blood pressure and reach or maintain a healthy weight.  General recommendations for healthful eating and cooking habits include:  Eating meals and snacks regularly. Avoid going long periods of time without eating to lose weight.  Eating a diet that consists mainly of plant-based foods, such as fruits, vegetables, nuts, legumes, and whole grains.  Using low-heat cooking methods, such as baking, instead of high-heat cooking methods, such as deep frying. Work with your dietitian to make sure you understand how to use the Nutrition Facts information on food labels. HOW CAN FOOD AFFECT ME? Carbohydrates Carbohydrates affect your blood glucose level more than any other type of food. Your dietitian will help you determine how many carbohydrates to eat at each meal  and teach you how to count carbohydrates. Counting carbohydrates is important to keep your blood glucose at a healthy level, especially if you are using insulin or taking certain medicines for diabetes mellitus. Alcohol Alcohol can cause sudden decreases in blood glucose (hypoglycemia), especially if you use insulin or take certain medicines for diabetes mellitus. Hypoglycemia can be a life-threatening condition. Symptoms of hypoglycemia (sleepiness, dizziness, and disorientation) are similar to symptoms of having too much alcohol.  If your health care provider has given you approval to drink alcohol, do so in moderation and use the following guidelines:  Women should not have more than one drink per day, and men should not have more than two drinks per day. One drink is equal to:  12 oz of beer.  5 oz of wine.  1 oz of hard liquor.  Do not drink on an empty stomach.  Keep yourself hydrated. Have water, diet soda, or unsweetened iced tea.  Regular soda, juice, and other mixers might contain a lot of carbohydrates and should be counted. WHAT FOODS ARE NOT RECOMMENDED? As you make food choices, it is important to remember that all foods are not the same. Some foods have fewer nutrients per serving than other foods, even though they might have the same number of calories or carbohydrates. It is difficult to get your body what it needs when you eat foods with fewer nutrients. Examples of foods that you should avoid that are high in calories and carbohydrates but low in nutrients include:  Trans fats (  most processed foods list trans fats on the Nutrition Facts label).  Regular soda.  Juice.  Candy.  Sweets, such as cake, pie, doughnuts, and cookies.  Fried foods. WHAT FOODS CAN I EAT? Eat nutrient-rich foods, which will nourish your body and keep you healthy. The food you should eat also will depend on several factors, including:  The calories you need.  The medicines you  take.  Your weight.  Your blood glucose level.  Your blood pressure level.  Your cholesterol level. You should eat a variety of foods, including:  Protein.  Lean cuts of meat.  Proteins low in saturated fats, such as fish, egg whites, and beans. Avoid processed meats.  Fruits and vegetables.  Fruits and vegetables that may help control blood glucose levels, such as apples, mangoes, and yams.  Dairy products.  Choose fat-free or low-fat dairy products, such as milk, yogurt, and cheese.  Grains, bread, pasta, and rice.  Choose whole grain products, such as multigrain bread, whole oats, and brown rice. These foods may help control blood pressure.  Fats.  Foods containing healthful fats, such as nuts, avocado, olive oil, canola oil, and fish. DOES EVERYONE WITH DIABETES MELLITUS HAVE THE SAME MEAL PLAN? Because every person with diabetes mellitus is different, there is not one meal plan that works for everyone. It is very important that you meet with a dietitian who will help you create a meal plan that is just right for you.   This information is not intended to replace advice given to you by your health care provider. Make sure you discuss any questions you have with your health care provider.   Document Released: 04/23/2005 Document Revised: 08/17/2014 Document Reviewed: 06/23/2013 Elsevier Interactive Patient Education [employer] [phone]

## 2016-05-28 NOTE — Progress Notes (Signed)
[facility] Progress Note  Subjective:  Theresa Diaz is a 50 y.o. who presents to follow-up lab result showing elevated hgb A1c.  Diabetes: - Patient with history of gestational diabetes - Hgb A1c obtained at last visit for family history (father) of diabetes and complaint of peripheral sensory changes (though exam most consistent with carpal tunnel syndrome) - A1c 6.7 on 05/04/16 - Works at [employer], where she can be on her feet for 4 hours without stopping at a time but does not regularly exercise, saying she is constrained by taking care of her kids - Says [facility] but unclear what kind of exam - Does eat a lot of rice in her diet and not much meat ROS: Denies frequent urination, abdominal pain  Chief Complaint  Patient presents with  . Follow-up    DM   No Known Allergies  Social: Never smoker  Objective: Blood pressure 113/67, pulse 78, temperature 98 F (36.7 C), temperature source Oral, height 5' (1.524 m), weight 149 lb 12.8 oz (67.9 kg), last menstrual period 05/28/2016. Body mass index is 29.26 kg/m. Constitutional: Overweight female, in NAD Abdominal: Soft. +BS, NT, ND, no rebound or guarding.  Neurological: Normal foot exam.  Vitals reviewed  Assessment/Plan: Diabetes (HCC) - New diagnosis. Discussed option of waiting to start medication with trial of weight loss and exercise and repeating hgb a1c in a couple of months. Patient prefers to start metformin. Recommended starting at 500 mg with breakfast x 1 week and increasing to BID if no side effects.  - Had elevated LDL (150) on last lipid screen but mildly elevated LFTs on last CMP; repeat CMP, obtain hepatitis panel; consider starting statin based on these results - Gave list of ophthalmologists  - Provided Dr. Gerilyn PilgrimSykes' card and will place nutrition referral - Performed microfilament foot exam - Will defer urine microalbumin as patient is currently  menstruating   Follow-up in about 2 months for repeat hgb A1c check.  Dani GobbleHillary Delbert Vu, MD [facility], PGY-2

## 2016-05-29 LAB — ACUTE HEP PANEL AND HEP B SURFACE AB
HCV AB: NEGATIVE
HEP A IGM: NONREACTIVE
HEP B S AG: NEGATIVE
Hep B C IgM: NONREACTIVE
Hep B S Ab: POSITIVE — AB

## 2016-06-01 ENCOUNTER — Encounter: Payer: Self-pay | Admitting: Internal Medicine

## 2016-06-01 DIAGNOSIS — E119 Type 2 diabetes mellitus without complications: Secondary | ICD-10-CM | POA: Insufficient documentation

## 2016-06-01 NOTE — Assessment & Plan Note (Addendum)
-   New diagnosis. Discussed option of waiting to start medication with trial of weight loss and exercise and repeating hgb a1c in a couple of months. Patient prefers to start metformin. Recommended starting at 500 mg with breakfast x 1 week and increasing to BID if no side effects.  - Had elevated LDL (150) on last lipid screen but mildly elevated LFTs on last CMP; repeat CMP, obtain hepatitis panel; consider starting statin based on these results - Gave list of ophthalmologists  - Provided Dr. Gerilyn PilgrimSykes' card and will place nutrition referral - Performed microfilament foot exam - Will defer urine microalbumin as patient is currently menstruating

## 2016-06-11 ENCOUNTER — Ambulatory Visit (INDEPENDENT_AMBULATORY_CARE_PROVIDER_SITE_OTHER): Payer: BLUE CROSS/BLUE SHIELD | Admitting: Family Medicine

## 2016-06-11 VITALS — Ht 60.0 in | Wt 140.0 lb

## 2016-06-11 DIAGNOSIS — Z309 Encounter for contraceptive management, unspecified: Secondary | ICD-10-CM

## 2016-06-11 DIAGNOSIS — R87618 Other abnormal cytological findings on specimens from cervix uteri: Secondary | ICD-10-CM | POA: Diagnosis not present

## 2016-06-11 LAB — POCT URINE PREGNANCY: PREG TEST UR: NEGATIVE

## 2016-06-11 NOTE — Progress Notes (Signed)
Patient ID: Theresa Diaz, female   DOB: 12/29/1965, 50 y.o.   MRN: [id number]  Chief Complaint  Patient presents with  . Colposcopy    HPI Theresa Borrowancy Che Breault is a 50 y.o. female.  Here for colposcopy HPI  Indications: Pap smear on September 2017 showed: Hig risk HPV. Previous colposcopy: NA. Prior cervical treatment: NA.  Past Medical History:  Diagnosis Date  . Cholelithiasis 10/2014    Past Surgical History:  Procedure Laterality Date  . CESAREAN SECTION  1999  . CHOLECYSTECTOMY N/A 11/01/2014   Procedure: LAPAROSCOPIC CHOLECYSTECTOMY WITH ATTEMPTED INTRAOPERATIVE CHOLANGIOGRAM;  Surgeon: Frederik SchmidtJay Wyatt, MD;  Location: [facility] OR;  Service: General;  Laterality: N/A;    Family History  Problem Relation Age of Onset  . Diabetes Father   . Hypertension Father     Social History Social History  Substance Use Topics  . Smoking status: Never Smoker  . Smokeless tobacco: Never Used  . Alcohol use No    No Known Allergies  Current Outpatient Prescriptions  Medication Sig Dispense Refill  . acetaminophen (TYLENOL) 500 MG tablet Take 1 tablet (500 mg total) by mouth every 6 (six) hours as needed. 30 tablet 3  . baclofen (LIORESAL) 10 MG tablet Take 0.5-1 tablets (5-10 mg total) by mouth 3 (three) times daily as needed for muscle spasms (shoulder or back pain). 30 each 5  . fish oil-omega-3 fatty acids 1000 MG capsule Take 1 g by mouth daily. Reported on 09/18/2015    . ibuprofen (ADVIL,MOTRIN) 200 MG tablet Take 400 mg by mouth every 8 (eight) hours as needed for mild pain or moderate pain.    . metFORMIN (GLUCOPHAGE) 500 MG tablet Take 1 tablet (500 mg total) by mouth daily with breakfast. Increase to 1 tablet with breakfast and 1 with dinner after 1 week. 60 tablet 3   No current facility-administered medications for this visit.     Review of Systems Review of Systems  Respiratory: Negative.   Cardiovascular: Negative.   Gastrointestinal: Negative.   Genitourinary: Negative.   All  other systems reviewed and are negative.   Height 5' (1.524 m), weight 140 lb (63.5 kg), last menstrual period 05/28/2016.  Physical Exam Physical Exam  Constitutional: She appears well-developed.  Cardiovascular:  No murmur heard. Genitourinary: Vagina normal. Pelvic exam was performed with patient supine. There is no rash or tenderness on the right labia. There is no rash or tenderness on the left labia.    Nursing note and vitals reviewed.   Data Reviewed PAP result  Assessment    Procedure Details  The risks and benefits of the procedure and Written informed consent obtained.  Speculum placed in vagina and excellent visualization of cervix achieved, cervix swabbed x 3 with acetic acid solution.  Specimens: NA  Complications: none.     Plan    Biopsy of the abnormal area recommended but patient declined. Risk of not performing biopsy discussed with patient. She verbalized understanding. She will wait to repeat PAP in 1 yr. Return precaution discussed.   F/U with PCP for other health concern.

## 2016-06-11 NOTE — Patient Instructions (Signed)
It was nice seeing you today. We did see some changes in your cervix that warranted biopsy. Biopsy not done at your request. Please repeat PAP in a year.

## 2016-06-16 ENCOUNTER — Encounter: Payer: Self-pay | Admitting: Internal Medicine

## 2016-08-05 ENCOUNTER — Encounter: Payer: Self-pay | Admitting: Family Medicine

## 2016-08-05 ENCOUNTER — Encounter: Payer: Self-pay | Admitting: Internal Medicine

## 2016-08-05 ENCOUNTER — Other Ambulatory Visit: Payer: Self-pay | Admitting: Internal Medicine

## 2016-08-05 ENCOUNTER — Ambulatory Visit (INDEPENDENT_AMBULATORY_CARE_PROVIDER_SITE_OTHER): Payer: BLUE CROSS/BLUE SHIELD | Admitting: Family Medicine

## 2016-08-05 DIAGNOSIS — M7702 Medial epicondylitis, left elbow: Secondary | ICD-10-CM

## 2016-08-05 DIAGNOSIS — E119 Type 2 diabetes mellitus without complications: Secondary | ICD-10-CM

## 2016-08-05 DIAGNOSIS — S29012A Strain of muscle and tendon of back wall of thorax, initial encounter: Secondary | ICD-10-CM

## 2016-08-05 MED ORDER — NAPROXEN 500 MG PO TABS: 500.0000 mg | ORAL_TABLET | Freq: Two times a day (BID) | ORAL | 0 refills | Status: DC | PRN

## 2016-08-05 NOTE — Assessment & Plan Note (Signed)
Patient is here with complaints of thoracic back pain. Physical exam yielded reproducible symptoms while pulling arms down from an overhead position. No scapular winging noted. Tenderness to palpation noted over the muscle body of the latissimus dorsi. Diagnosis likely strain/muscle soreness of bilateral latissimus dorsi. - Rest - Ice - Anti-inflammatories - Anticipate improvement in 1-2 weeks - Follow-up as needed

## 2016-08-05 NOTE — Progress Notes (Signed)
Was informed by provider seeing patient in office today that she has been unable to see an Ophthalmologist despite receiving list of providers at last visit. Will place referral, as patient needs diabetic eye exam.

## 2016-08-05 NOTE — Assessment & Plan Note (Signed)
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 Patient endorses symptoms of nerve involvement and Tinel's was positive over the cubital tunnel. Discomfort distributed over the fifth phalange of the affected arm. - Encourage rest - Ice - Anti-inflammatories: 500 mg naproxen twice a day 5 days then as needed after that. - Follow-up as needed  Next: If patient's symptoms worsen or persist would consider further evaluation for cubital tunnel entrapment.

## 2016-08-05 NOTE — Patient Instructions (Addendum)
It was a pleasure seeing you today in our clinic. Today we discussed your back pain and left elbow pain. Here is the treatment plan we have discussed and agreed upon together:   - I've prescribed do naproxen. Take 1 tablet twice a day over the next 7 days. You can then take 1 tablet twice a day as needed after that. - Try to ice this left elbow for 20 minutes 3 times a day every day over the next 2-3 weeks. - Come back and see us in about 4 weeks if you are continuing to have this elbow pain.

## 2016-08-05 NOTE — Progress Notes (Signed)
   HPI  CC: low back pain and left elbow pain Low back pain 2-3 weeks Lumbar/thoracic region. Seems to cause fatigue. Better when laying down. Feels tight. Bilateral. Works as a Child psychotherapistwaitress. No injury/fall. No radiation to hips or feet. No weakness, numbness, paresthesias. No saddle anesthesia. No fecal/urinary incontinence.  Elbow pain 2 weeks. No injury. Worse when holding pots/pans. Medial. Travels to pinky. Feels like "numbness". No hand weakness. No history of dropping objects.  Review of Systems    See HPI for ROS. All other systems reviewed and are negative.  CC, SH/smoking status, and VS noted  Objective: BP 112/78   Pulse 72   Temp 98.3 F (36.8 C) (Oral)   Wt 135 lb (61.2 kg)   BMI 26.37 kg/m  Gen: NAD, alert, cooperative, and pleasant. CV: Well-perfused, good capillary refill Resp: non-labored Back: No erythema, rash, ecchymoses, or bony deformities. ROM intact in all directions. TTP along the lateral inferior thoracic cage. No tenderness along the paraspinal muscles. No spinous process tenderness. Discomfort is reproduced with pulling her arms down when raised above her head. No scapular winging appreciated. Elbow, left: No erythema, rash, ecchymoses, or bony deformities. ROM intact in all directions. TTP along the medial epicondyles (greatest) and bulk of the flexor muscle bodies. Reproducible pain with pronation. Positive Tinel's at cubital tunnel.   Assessment and plan:  Strain of latissimus dorsi muscle Patient is here with complaints of thoracic back pain. Physical exam yielded reproducible symptoms while pulling arms down from an overhead position. No scapular winging noted. Tenderness to palpation noted over the muscle body of the latissimus dorsi. Diagnosis likely strain/muscle soreness of bilateral latissimus dorsi. - Rest - Ice - Anti-inflammatories - Anticipate improvement in 1-2 weeks - Follow-up as needed  Medial epicondylitis of left elbow Patient is  presenting with signs and symptoms consistent with medial epicondylitis versus cubital tunnel entrapment. Patient endorses symptoms of nerve involvement and Tinel's was positive over the cubital tunnel. Discomfort distributed over the fifth phalange of the affected arm. - Encourage rest - Ice - Anti-inflammatories: 500 mg naproxen twice a day 5 days then as needed after that. - Follow-up as needed  Next: If patient's symptoms worsen or persist would consider further evaluation for cubital tunnel entrapment.    Meds ordered this encounter  Medications  . naproxen (NAPROSYN) 500 MG tablet    Sig: Take 1 tablet (500 mg total) by mouth 2 (two) times daily as needed for moderate pain.    Dispense:  30 tablet    Refill:  0     Kathee DeltonIan D McKeag, MD,MS,  PGY3 08/05/2016 6:12 PM

## 2016-09-29 ENCOUNTER — Other Ambulatory Visit: Payer: Self-pay | Admitting: Internal Medicine

## 2016-09-29 DIAGNOSIS — E119 Type 2 diabetes mellitus without complications: Secondary | ICD-10-CM

## 2016-12-15 ENCOUNTER — Ambulatory Visit: Payer: BLUE CROSS/BLUE SHIELD | Admitting: Internal Medicine

## 2016-12-28 ENCOUNTER — Encounter: Payer: Self-pay | Admitting: Internal Medicine

## 2016-12-28 ENCOUNTER — Ambulatory Visit (INDEPENDENT_AMBULATORY_CARE_PROVIDER_SITE_OTHER): Payer: BLUE CROSS/BLUE SHIELD | Admitting: Internal Medicine

## 2016-12-28 VITALS — BP 110/70 | HR 75 | Temp 98.6°F | Ht 60.0 in | Wt 136.0 lb

## 2016-12-28 DIAGNOSIS — E119 Type 2 diabetes mellitus without complications: Secondary | ICD-10-CM | POA: Diagnosis not present

## 2016-12-28 DIAGNOSIS — G5622 Lesion of ulnar nerve, left upper limb: Secondary | ICD-10-CM

## 2016-12-28 LAB — POCT GLYCOSYLATED HEMOGLOBIN (HGB A1C): HEMOGLOBIN A1C: 6.3

## 2016-12-28 MED ORDER — METFORMIN HCL 500 MG PO TABS: 500.0000 mg | ORAL_TABLET | Freq: Every day | ORAL | 1 refills | Status: DC

## 2016-12-28 MED ORDER — NAPROXEN 500 MG PO TABS: 500.0000 mg | ORAL_TABLET | Freq: Two times a day (BID) | ORAL | 0 refills | Status: DC | PRN

## 2016-12-28 NOTE — Patient Instructions (Signed)
C Okun,  C?m ?n b?n ? ??n trong ngy hm nay. ???ng trong mu c?a b?n trng t?t h?n. Vui lng dng metformin 500 mg m?i ngy m?t l?n.  ??i v?i ?au khu?u tay c?a b?n, hy th? ?eo m?t c ?p vo ban ?m ?? gi? cho n th?ng v th? naproxen 500 mg m?t l?n ho?c hai l?n m?i ngy khi c?n thi?t cho ?au.  Ti mu?n g?p l?i b?n trong kho?ng 3 thng ?? ki?m tra l??ng ???ng trong mu v ki?m tra phng th nghi?m.  T?t, Ti?n s? Sampson Goon  Ms. Fern,  Thank you for coming in today. Your blood sugar looks better. Please take metformin 500 mg once daily.  For your elbow pain, try wearing a brace at night to keep it straight and try naproxen 500 mg once or twice daily as needed for pain.  I would like to see you back in about 3 months for blood sugar and check labs.  Best, Dr. Augusto Garbe Tunnel Syndrome Cubital tunnel syndrome is a condition that causes pain and weakness of the forearm and hand. This condition happens when one of the nerves (ulnar nerve) that runs alongside the elbow joint becomes irritated. What are the causes? Causes of this condition include:  Increased pressure on the ulnar nerve at the elbow, arm, or forearm. This can be caused by:  Swollen tissues.  Ligaments.  Muscles.  Poorly healed elbow fractures.  Tumors in the elbow. These are usually noncancerous (benign).  Scar tissue that develops in the elbow after an injury.  Bony growths (spurs) near the ulnar nerve.  Stretching of the nerve due to loose elbow ligaments.  Trauma to the nerve at the elbow.  Repetitive elbow bending.  Certain medical conditions. What increases the risk? This condition is more likely to develop in:  People who do manual labor that requires frequently bending the elbow.  People who play sports that include repeated or strenuous throwing motions, such as baseball.  People who play contact sports, such as football or lacrosse.  People who do not warm up properly before  activities.  People who have diabetes.  People who have an underactive thyroid (hypothyroidism). What are the signs or symptoms? Symptoms of this condition include:  Clumsiness or weakness of the hand.  Tenderness of the inner elbow.  Aching or soreness of the inner elbow, forearm, or fingers, especially the little finger or the ring finger.  Increased pain with forced elbow bending.  Reduced control when throwing.  Tingling, numbness, or burning inside the forearm, or in part of the hand or fingers, especially the little finger or the ring finger.  Sharp pains that shoot from the elbow down to the wrist and hand.  The inability to grip or pinch hard. How is this diagnosed? This condition is diagnosed with a medical history and physical exam. Your health care provider will ask about your symptoms and ask for details about any injury. You may also have other tests, including:  Electromyogram (EMG). This test checks how well the nerve is working.  X-ray. How is this treated? Treatment starts by stopping the activities that are causing your symptoms to get worse. Treatment may include the use of icing and medicines to reduce pain and swelling. You may also be advised to wear a splint to prevent your elbow from bending or wear an elbow pad where the ulnar nerve is closest to the skin. In less severe cases, treatment may also include working with a  physical therapist:  To help decrease your symptoms.  To improve the strength and range of motion of your elbow, forearm, and hand. If the treatments described above do not help, surgery may be needed. Follow these instructions at home: If you have a splint:   Wear it as told by your health care provider. Remove it only as told by your health care provider.  Loosen the splint if your fingers become numb and tingle, or if they turn cold and blue.  Keep the splint clean and dry. Managing pain, stiffness, and swelling   If directed,  apply ice to the injured area:  Put ice in a plastic bag.  Place a towel between your skin and the bag.  Leave the ice on for 20 minutes, 2-3 times per day.  Move your fingers often to avoid stiffness and to lessen swelling.  Raise (elevate) the injured area above the level of your heart while you are sitting or lying down. General instructions   Take over-the-counter and prescription medicines only as told by your health care provider.  Keep all follow-up visits as told by your health care provider. This is important.  Do any exercise or physical therapy as told by your health care provider.  Do not drive or operate heavy machinery while taking prescription pain medicine.  If you were given an elbow pad, wear it as told by your health care provider. Contact a health care provider if:  Your symptoms get worse.  Your symptoms do not get better with treatment.  Your have new pain.  Your hand on the injured side feels numb or cold. This information is not intended to replace advice given to you by your health care provider. Make sure you discuss any questions you have with your health care provider. Document Released: 07/27/2005 Document Revised: 01/02/2016 Document Reviewed: 10/03/2014 Elsevier Interactive Patient Education  2017 [employer]

## 2016-12-28 NOTE — Progress Notes (Signed)
[facility] Progress [facility]  Subjective:  Theresa Diaz is a 51 y.o. female with history of left elbow pain, back pain, T2DM who presents for follow-up of T2DM and elbow pain. Visit assisted by Theresa Diaz video interpreter Theresa Hashimotoatricia [phone]).   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 Body mass index is 26.56 kg/m. Constitutional: Overweight female, in NAD Cardiovascular: RRR, S1, S2, no m/r/g.  Pulmonary/Chest: Effort normal and breath sounds normal. No respiratory distress.  Musculoskeletal: Negative Tinel's sign over left cubital tunnel, FROM at shoulder Neurological: AOx3, no focal deficits. Hand grip 5/5 bilaterally.  Psychiatric: Normal mood and affect.  Vitals reviewed  Assessment/Plan: Diabetes (HCC) - Improved with A1c of 6.3 compared to 6.7 last September. Since has had improvement with intermittent use of  metformin, advised continuing at lower dose of 500 mg daily.  - Explained how medication works and why blood sugar control is important in reducing complications of diabetes and cardiovascular risk. - Recommended trying to lose weight through more regular exercise with motivation that she may no longer need medication. Counseled to try to eat protein with carbohydrates to reduce blood sugar spikes.  - Asked her to bring her Ophthalmology records at next visit - Declined pneumococcal vaccine and urine microalbumin today, said would get at next OV   Cubital tunnel syndrome on left - May be somewhat improved as can no longer elicit Tinel's sign, but patient still with nighttime awakenings. - Would not say that she has failed conservative management, as she had not tried treatments recommended at last visit (question if patient understood last instructions as she sometimes declines interpreter) - Recommended taking naproxen as needed for pain/swelling - Suggested wearing elbow brace at night to keep arm straight and reduce symptoms; patient prefers to get elbow pad from sports store instead of receiving Rx for elbow brace and obtaining as DME - Reviewed some wrist and elbow exercises that may help with compression - Consider referring to Sports Medicine if not improved at follow-up, as would have been ongoing for > 6 months at that time   Follow-up in 3 months to recheck A1c, obtain urine microalbumin, administer pneumococcal 23 vaccine and assess for improvement of L elbow pain.  Dani GobbleHillary Buffey Zabinski, MD [facility], PGY-2

## 2016-12-31 DIAGNOSIS — G5622 Lesion of ulnar nerve, left upper limb: Secondary | ICD-10-CM

## 2016-12-31 HISTORY — DX: Lesion of ulnar nerve, left upper limb: G56.22

## 2016-12-31 NOTE — Assessment & Plan Note (Addendum)
-   Improved with A1c of 6.3 compared to 6.7 last September. Since has had improvement with intermittent use of metformin, advised continuing at lower dose of 500 mg daily.  - Explained how medication works and why blood sugar control is important in reducing complications of diabetes and cardiovascular risk. - Recommended trying to lose weight through more regular exercise with motivation that she may no longer need medication. Counseled to try to eat protein with carbohydrates to reduce blood sugar spikes.  - Asked her to bring her Ophthalmology records at next visit - Declined pneumococcal vaccine and urine microalbumin today, said would get at next OV

## 2016-12-31 NOTE — Assessment & Plan Note (Signed)
-   May be somewhat improved as can no longer elicit Tinel's sign, but patient still with nighttime awakenings. - Would not say that she has failed conservative management, as she had not tried treatments recommended at last visit (question if patient understood last instructions as she sometimes declines interpreter) - Recommended taking naproxen as needed for pain/swelling - Suggested wearing elbow brace at night to keep arm straight and reduce symptoms; patient prefers to get elbow pad from sports store instead of receiving Rx for elbow brace and obtaining as DME - Reviewed some wrist and elbow exercises that may help with compression - Consider referring to Sports Medicine if not improved at follow-up, as would have been ongoing for > 6 months at that time

## 2017-02-22 ENCOUNTER — Other Ambulatory Visit: Payer: Self-pay | Admitting: Family Medicine

## 2017-02-22 DIAGNOSIS — Z1231 Encounter for screening mammogram for malignant neoplasm of breast: Secondary | ICD-10-CM

## 2017-03-15 ENCOUNTER — Encounter: Payer: Self-pay | Admitting: Internal Medicine

## 2017-03-15 ENCOUNTER — Ambulatory Visit (INDEPENDENT_AMBULATORY_CARE_PROVIDER_SITE_OTHER): Payer: BLUE CROSS/BLUE SHIELD | Admitting: Internal Medicine

## 2017-03-15 VITALS — BP 100/70 | HR 72 | Temp 98.2°F | Wt 139.0 lb

## 2017-03-15 DIAGNOSIS — E785 Hyperlipidemia, unspecified: Secondary | ICD-10-CM | POA: Diagnosis not present

## 2017-03-15 DIAGNOSIS — E119 Type 2 diabetes mellitus without complications: Secondary | ICD-10-CM

## 2017-03-15 LAB — POCT GLYCOSYLATED HEMOGLOBIN (HGB A1C): HEMOGLOBIN A1C: 6.4

## 2017-03-15 MED ORDER — METFORMIN HCL 500 MG PO TABS: 500.0000 mg | ORAL_TABLET | Freq: Two times a day (BID) | ORAL | 2 refills | Status: DC

## 2017-03-15 NOTE — Patient Instructions (Signed)
C Ruotolo, R?t vui ???c g?p b?n hm nay. Vui lng t?ng metformin ln 500 mg hai l?n m?i ngy. Ti s? g?i cho b?n k?t qu? phng th nghi?m c?a b?n. Vui lng g?p l?i ti sau 6 thng ho?c s?m h?n khi c?n. T?t, Ti?n s? Fitzgerald  Ms. Semel,  It was nice to see you today.  Please increase metformin to 500 mg twice daily.  I will mail you your lab results.  Please see me back in 6 months or sooner as needed.   Best, Dr. Ola Spurr  Pneumococcal Vaccine, Polyvalent solution for injection ?y l thu?c g? THU?C CH?NG NG?A PH? C?U KHU?N, ?A GI l thu?c ch?ng ng?a ?? phng ng?a nhi?m ph? c?u khu?n. Nh?ng vi khu?n ny l nguyn nhn chnh gy ra nhi?m trng tai, nhi?m trng h?ng do lin c?u khu?n, v vim mng no, vim ph?i nghim tr?ng, ho?c nhi?m trng huy?t trn ton th? gi?i. Nh?ng thu?c ch?ng ng?a ny h? tr? c? th? s?n xu?t ra cc khng th? (cc ch?t b?o v?) gip c? th? qu v? khng l?i nh?ng vi khu?n ny. Thu?c ch?ng ng?a ny ???c khuy?n co cho ng??i c v?n ?? v? s?c kh?e t? 2 tu?i tr? ln. Thu?c c?ng ???c khuyn dng cho t?t c? ng??i l?n trn 50 tu?i. Thu?c ch?ng ng?a ny khng ?i?u tr? ???c b?nh nhi?m trng. Thu?c ny c th? ???c dng cho nh?ng m?c ?ch khc; hy h?i ng??i cung c?p d?ch v? y t? ho?c d??c s? c?a mnh, n?u qu v? c th?c m?c. (CC) NHN HI?U PH? BI?N: Pneumovax 23 Ti c?n ph?i bo cho ng??i cung c?p d?ch v? y t? c?a mnh ?i?u g tr??c khi dng thu?c ny? H? c?n bi?t Krebbs?u qu v? c b?t k? tnh tr?ng no sau ?y khng: -cc v?n ?? v? ch?y mu -ghp t?y x??ng ho?c ghp t?ng -ung th?, b?nh Hodgkin -s?t -nhi?m trng -cc v?n ?? v? h? mi?n d?ch -l??ng ti?u c?u trong mu th?p -co gi?t (kinh phong) -ph?n ?ng b?t th??ng ho?c d? ?ng v?i thu?c ch?ng ng?a ph? c?u khu?n, gi?i ??c t? b?ch h?u, cc thu?c ch?ng ng?a khc, ho?c ch?t latex -pha?n ??ng b?t th???ng ho??c di? ??ng v??i ca?c d??c ph?m kha?c, th?c ph?m, thu?c nhu?m, ho??c ch?t ba?o qua?n -?ang c thai ho??c ??nh co? thai -?ang  cho con bu? Ti nn s? d?ng thu?c ny nh? th? no? Thu?c chu?ng ng??a ny ?? tim vo b?p th?t ho??c tim d???i da. Thu?c th??ng ???c s? d?ng b?i chuyn vin y t?. T? [place] v? Thu?c Ch?ng Ng?a s? ???c pht tr??c m?i l?n ch?ng ng?a. Hy ??c k? t? thng tin ny m?i l?n. T? thng tin c th? thay ??i th??ng xuyn. Hy bn v?i bc s? nhi khoa c?a qu v? v? vi?c dng thu?c ny ? tr? em. Thu?c ny c th? ???c k toa cho tr? em ch? m?i 2 tu?i trong nh?ng tr??ng h?p ch?n l?c, nh?ng c?n ph?i th?n tr?ng. Qu [place]renz?u: N?u qu v? cho r?ng mnh ? dng qu nhi?u thu?c ny, th hy lin l?c v?i trung tm ki?m sot ch?t ??c ho?c phng c?p c?u ngay l?p t?c. L?U : Thu?c ny ch? dnh ring cho qu v?. Khng chia s? thu?c ny v?i nh?ng ng??i khc. N?u ti l? qun m?t [place]?u th sao? ?i?u quan tr?ng l khng nn b? l? [place]?u thu?c no. Hy lin l?c v?i bc s? ho?c [place] vin y t? c?a mnh, n?u qu v? khng th? gi? ?  ng cu?c h?n khm. Nh?ng g c th? t??ng tc v?i thu?c ny? -cc thu?c ha tr? [place]?u cho b?nh ung th? -cc thu?c ?c ch? h? mi?n d?ch -m?t s? thu?c ?i?u tr? ho?c phng ng?a c?c mu ?ng, ch?ng h?n nh? warfarin, enoxaparin, v dalteparin -cc thu?c steroid, ch?ng h?n nh? prednisone ho?c cortisone Danh sch ny c th? khng m t? ?? h?t cc t??ng tc c th? x?y ra. Hy ??a cho ng??i cung c?p d?ch v? y t? c?a mnh danh sch t?t c? cc thu?c, th?o d??c, cc thu?c khng c?n toa, ho?c cc ch? ph?m b? sung m qu v? dng. C?ng nn bo cho h? bi?t r?ng qu v? c ht thu?c, u?ng r??u, ho?c c s? d?ng ma ty tri php hay khng. Vi th? c th? t??ng tc v?i thu?c c?a qu v?. Ti c?n ph?i theo di ?i?u g trong khi dng thu?c ny? Tnh tr?ng s?t nh? v ?au s? h?t sau 3 ngy ho?c s?m h?n. Hy bo cho bc s? ho?c chuyn vin y t? bi?t v? b?t k? tri?u ch?ng b?t th??ng no c?a mnh. Ti c th? nh?n th?y nh?ng tc d?ng ph? no khi dng thu?c ny? Nh?ng tc d?ng ph? qu v? c?n ph?i bo cho bc s? ho?c chuyn vin y t? cng s?m  cng t?t: -cc ph?n ?ng d? ?ng, ch?ng h?n nh? da b? m?n ??, ng?a, n?i my ?ay, s?ng ? m?t, mi, ho?c l??i -kh th? -b? l l?n -s?t trn 102 ?? F -?au, t ho?c c?m gic nh? b? ki?n b ? bn tay ho?c bn chn -co gi?t (kinh phong) -b? b?m tm ho?c xu?t huy?t b?t th??ng -y?u c? b?p b?t th??ng Cc tc d?ng ph? khng c?n ph?i ch?m [place] y t? (hy bo cho bc s? ho?c chuyn vin y t?, n?u cc tc d?ng ph? ny ti?p di?n ho?c gy phi?n toi): -?au v nh?c -tiu ch?y -s?t 102 ?? F ho?c th?p h?n -?au ??u -d? cu k?nh -m?t c?m gic ngon mi?ng -?au,  ?m ? ch? tim -kh ng? Danh sch ny c th? khng m t? ?? h?t cc tc d?ng ph? c th? x?y ra. Xin g?i t?i bc s? c?a mnh ?? ???c c? v?n chuyn mn v? cc tc d?ng ph?Sander Nephew v? c th? t??ng trnh cc tc d?ng ph? cho FDA theo s? [phone]. Ti nn c?t gi? thu?c c?a mnh ? ?u? ?i?u ny khng p d?ng. Thu?c ny ch? ???c dng trong phng m?ch, v?n phng bc s? ho?c c? s? y t? khc v s? khng ???c c?t gi? t?i nh. L?U : ?y l b?n tm t?t. N c th? khng bao hm t?t c? thng tin c th? c. N?u qu v? th?c m?c v? thu?c ny, xin trao ??i v?i bc s?, d??c s?, ho?c ng??i cung c?p d?ch v? y t? c?a mnh.  2018 Elsevier/Gold Standard (2014-12-27 00:00:00)

## 2017-03-15 NOTE — Progress Notes (Addendum)
[facility] Progress Note  Subjective:  Theresa Diaz is a 51 y.o. female who presents for follow-up of diabetes. Visit assisted by Theresa Diaz video interpreter.   #T2DM: - Ran out of metformin yesterday but otherwise has been taking metformin 500 mg daily. 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 - Has not made changes to her diet. Does eat a lot of rice. - Has questions about role of her medication in controlling her blood sugar. - Had previously declined starting medication for HLD when screened last spring but would like a repeat screen, as she says she would start treatment if needed now.   ROS: Occasional back pain that improves with rest. No increased urinary frequency. No abdominal pain or diarrhea.   Social: Never smoker  No Known Allergies  Objective: Temperature 98.2 F (36.8 C), temperature source Oral, weight 139 lb (63 kg), SpO2 99 %. Body mass index is 27.15 kg/m. Constitutional: Overweight female in NAD  Cardiovascular: RRR, S1, S2, no m/r/g.  Pulmonary/Chest: Effort normal and breath sounds normal. No respiratory distress.  Musculoskeletal: No TTP over paraspinal muscles and no midline spinal TTP.  Psychiatric: Normal mood and affect.  Vitals reviewed  Assessment/Plan: Diabetes (HCC) - Hgb A1c stable at 6.4, compared to 6.3 three months ago - Recommended increasing metformin to 500 mg BID - Will screen for HLD and plan to send in moderate intensity statin if still elevated. Patient in agreement. - Obtain urine protein/creatinine. - Patient reports having had an eye exam this year but does not recall name of doctor. Thinks practice was on [address]. - Declined pneumonia vaccine today. Gave handout on it for her to read.   Follow-up in about 6 months for blood sugar check.  Dani GobbleHillary Fitzgerald, MD [facility], PGY-3

## 2017-03-15 NOTE — Progress Notes (Deleted)
[facility] Progress Note  Subjective:  Theresa Diaz is a 51 y.o.    No side effects from metformin  Out for 1 day   Chief Complaint  Patient presents with  . Diabetes    Past Medical History:  Diagnosis Date  . Cholelithiasis 10/2014    Social History   Social History  . Marital status: Married    Spouse name: Theresa Diaz  . Number of children: 2  . Years of education: 12   Occupational History  . Waitress [employer]   Social History Main Topics  . Smoking status: Never Smoker  . Smokeless tobacco: Never Used  . Alcohol use No  . Drug use: No  . Sexual activity: Yes    Birth control/ protection: Post-menopausal   Other Topics Concern  . Not on file   Social History Narrative   [place] with husband Theresa Diaz, [place]1962) and her two sons Theresa Diaz, Theresa Molesky 2001).   Works as a Child psychotherapistwaitress, part time.   Speaks [place] and [place].       Cell [phone]    No Known Allergies  Objective: Blood pressure 100/70, pulse 72, temperature 98.2 F (36.8 C), temperature source Oral, weight 139 lb (63 kg), SpO2 99 %.  Constitutional:  HENT:  Cardiovascular: RRR, S1, S2, no m/r/g.  Pulmonary/Chest: Effort normal and breath sounds normal. No respiratory distress.  Abdominal: Soft. +BS, soft, NT, ND, no rebound or guarding.  Musculoskeletal: ** Neurological: AOx3, no focal deficits. Skin: Skin is warm and dry. No rash noted. No erythema.  Psychiatric: Normal mood and affect.  Vitals reviewed  Assessment/Plan: No problem-specific Assessment & Plan notes found for this encounter.   Follow-up **.  Dani GobbleHillary Momoko Slezak, MD [facility], PGY-2

## 2017-03-16 LAB — LIPID PANEL
Chol/HDL Ratio: 3.7 ratio (ref 0.0–4.4)
Cholesterol, Total: 221 mg/dL — ABNORMAL HIGH (ref 100–199)
HDL: 60 mg/dL (ref >39)
LDL CALC: 133 mg/dL — AB (ref 0–99)
Triglycerides: 139 mg/dL (ref 0–149)
VLDL CHOLESTEROL CAL: 28 mg/dL (ref 5–40)

## 2017-03-16 LAB — PROTEIN / CREATININE RATIO, URINE
Creatinine, Urine: 18.6 mg/dL
Protein, Ur: <4 mg/dL

## 2017-03-17 NOTE — Assessment & Plan Note (Addendum)
-   Hgb A1c stable at 6.4, compared to 6.3 three months ago - Recommended increasing metformin to 500 mg BID - Will screen for HLD and plan to send in moderate intensity statin if still elevated. Patient in agreement. - Obtain urine protein/creatinine. - Patient reports having had an eye exam this year but does not recall name of doctor. Thinks practice was on [address]. - Declined pneumonia vaccine today. Gave handout on it for her to read.

## 2017-03-22 ENCOUNTER — Encounter: Payer: Self-pay | Admitting: Internal Medicine

## 2017-03-22 ENCOUNTER — Other Ambulatory Visit: Payer: Self-pay | Admitting: Internal Medicine

## 2017-03-22 DIAGNOSIS — E785 Hyperlipidemia, unspecified: Secondary | ICD-10-CM

## 2017-03-22 MED ORDER — ATORVASTATIN CALCIUM 10 MG PO TABS: 10.0000 mg | ORAL_TABLET | Freq: Every day | ORAL | 3 refills | Status: DC

## 2017-04-22 ENCOUNTER — Ambulatory Visit (INDEPENDENT_AMBULATORY_CARE_PROVIDER_SITE_OTHER): Payer: BLUE CROSS/BLUE SHIELD | Admitting: Internal Medicine

## 2017-04-22 ENCOUNTER — Encounter: Payer: Self-pay | Admitting: Internal Medicine

## 2017-04-22 VITALS — BP 116/78 | HR 68 | Ht 60.0 in | Wt 136.6 lb

## 2017-04-22 DIAGNOSIS — E119 Type 2 diabetes mellitus without complications: Secondary | ICD-10-CM | POA: Diagnosis not present

## 2017-04-22 DIAGNOSIS — R42 Dizziness and giddiness: Secondary | ICD-10-CM

## 2017-04-22 DIAGNOSIS — E785 Hyperlipidemia, unspecified: Secondary | ICD-10-CM

## 2017-04-22 MED ORDER — ATORVASTATIN CALCIUM 10 MG PO TABS: 10.0000 mg | ORAL_TABLET | Freq: Every day | ORAL | 3 refills | Status: DC

## 2017-04-22 MED ORDER — METFORMIN HCL 500 MG PO TABS: 500.0000 mg | ORAL_TABLET | Freq: Two times a day (BID) | ORAL | 2 refills | Status: DC

## 2017-04-22 NOTE — Assessment & Plan Note (Addendum)
-   Has had 2 brief recent episodes associated with positional changes. Suspect orthostatic hypotension because sx improved with lying down and sitting, though difference between sitting and standing SBP only 12 in office today. No focal findings on neuro exam and no current difficulties with balance. Patient may have been slightly hypoglycemic, as she had not had much to eat near the time of these episodes. Hormonal changes of menopause could also be contributing. No sensation of palpitations or chest pain to suggest episodes of arythmia. Not on any anti-hypertensives or medications that would cause hypoglycemia. Symptoms do not persist, so BPPV less likely. Was seen for this in 2017 and had normal TSH and CBC but elevated FSH, indicative of menopause. - Recommended increasing fluid intake and to make position changes slowly.  - Provided handout on orthostatic hypotension

## 2017-04-22 NOTE — Progress Notes (Signed)
[facility] Progress Note  Subjective:  Theresa Diaz is a 51 y.o. female with history of T2DM and carpal tunnel syndrome who presents for dizziness. She declined [place]namese interpreter. Patient had a couple brief episodes last week. The first happened when she was in the kitchen and turned quickly. She felt weak and like she would fall. Denies actual sensation of room spinning. She went to bed and felt fine in the morning. The second episode occurred after she got up from washing the floor. She says she sat for a few minutes then felt better. She denies headaches, changes in vision, n/v/d. She does not always have 3 meals a day and often skips breakfast. She does not think she had much to eat those days. She does endorse drinking 6-7 glasses of water a day. No further episodes. Denies sick contacts.  ROS: No fevers, no syncope, no chest pain  No Known Allergies  Objective: Blood pressure 116/78, pulse 68, height 5' (1.524 m), weight 136 lb 9.6 oz (62 kg). BP standing 104/68 and laying down 112/70. Body mass index is 26.68 kg/m. Has not had menstrual period in almost a year. Constitutional: Well-appearing, in NAD HENT: MMM, normal TMs bilaterally. Cardiovascular: RRR, S1, S2, no m/r/g.  Pulmonary/Chest: Effort normal and breath sounds normal.  Neurological: AOx3, no focal deficits. CNII-XII intact. Negative Romberg and no pronator drift. Could not induce dizziness with turning head.  Psychiatric: Normal mood and affect.  Vitals reviewed  Assessment/Plan: Dizziness - Has had 2 brief recent episodes associated with positional changes. Suspect orthostatic hypotension, though difference between sitting and standing SBP only 12. No focal findings on neuro exam and no current difficulties with balance. Patient may have been slightly hypoglycemic, as she had not had much to eat near the time of these episodes. Hormonal changes of menopause could also be contributing. No sensation of  palpitations or chest pain to suggest episodes of arythmia. Not on any anti-hypertensives or medications that would cause hypoglycemia. Symptoms do not persist, so BPPV less likely. Was seen for this in 2017 and had normal TSH and CBC but elevated FSH, indicative of menopause. - Recommended increasing fluid intake and to make position changes slowly.  - Provided handout on orthostatic hypotension  Follow-up if symptoms last longer than a few moments or become more frequent.  Dani GobbleHillary Shayanne Gomm, MD [facility], PGY-3

## 2017-04-22 NOTE — Patient Instructions (Signed)
Theresa Diaz,  It sounds like your dizziness happened with changes in position, which fits with orthostatic hypotension -- blood pressure getting low with changes in position. If episodes lasted longer or you had actually fallen or passed out there are some others tests we should do, but the short length of symptoms is reassuring.  Please increase water intake and change positions (from sitting down to standing up) slowly to give your blood pressure a chance to catch up. See me back if you continue to have episodes.  Best, Dr. Sampson Goon   Orthostatic Hypotension Orthostatic hypotension is a sudden drop in blood pressure that happens when you quickly change positions, such as when you get up from a seated or lying position. Blood pressure is a measurement of how strongly, or weakly, your blood is pressing against the walls of your arteries. Arteries are blood vessels that carry blood from your heart throughout your body. When blood pressure is too low, you may not get enough blood to your brain or to the rest of your organs. This can cause weakness, light-headedness, rapid heartbeat, and fainting. This can last for just a few seconds or for up to a few minutes. Orthostatic hypotension is usually not a serious problem. However, if it happens frequently or gets worse, it may be a sign of something more serious. What are the causes? This condition may be caused by:  Sudden changes in posture, such as standing up quickly after you have been sitting or lying down.  Blood loss.  Loss of body fluids (dehydration).  Heart problems.  Hormone (endocrine) problems.  Pregnancy.  Severe infection.  Lack of certain nutrients.  Severe allergic reactions (anaphylaxis).  Certain medicines, such as blood pressure medicine or medicines that make the body lose excess fluids (diuretics). Sometimes, this condition can be caused by not taking medicine as directed, such as taking too much of a certain  medicine.  What increases the risk? Certain factors can make you more likely to develop orthostatic hypotension, including:  Age. Risk increases as you get older.  Conditions that affect the heart or the central nervous system.  Taking certain medicines, such as blood pressure medicine or diuretics.  Being pregnant.  What are the signs or symptoms? Symptoms of this condition may include:  Weakness.  Light-headedness.  Dizziness.  Blurred vision.  Fatigue.  Rapid heartbeat.  Fainting, in severe cases.  How is this diagnosed? This condition is diagnosed based on:  Your medical history.  Your symptoms.  Your blood pressure measurement. Your health care provider will check your blood pressure when you are: ? Lying down. ? Sitting. ? Standing.  A blood pressure reading is recorded as two numbers, such as "120 over 80" (or 120/80). The first ("top") number is called the systolic pressure. It is a measure of the pressure in your arteries as your heart beats. The second ("bottom") number is called the diastolic pressure. It is a measure of the pressure in your arteries when your heart relaxes between beats. Blood pressure is measured in a unit called mm Hg. Healthy blood pressure for adults is 120/80. If your blood pressure is below 90/60, you may be diagnosed with hypotension. Other information or tests that may be used to diagnose orthostatic hypotension include:  Your other vital signs, such as your heart rate and temperature.  Blood tests.  Tilt table test. For this test, you will be safely secured to a table that moves you from a lying position to an upright  position. Your heart rhythm and blood pressure will be monitored during the test.  How is this treated? Treatment for this condition may include:  Changing your diet. This may involve eating more salt (sodium) or drinking more water.  Taking medicines to raise your blood pressure.  Changing the dosage of  certain medicines you are taking that might be lowering your blood pressure.  Wearing compression stockings. These stockings help to prevent blood clots and reduce swelling in your legs.  In some cases, you may need to go to the hospital for:  Fluid replacement. This means you will receive fluids through an IV tube.  Blood replacement. This means you will receive donated blood through an IV tube (transfusion).  Treating an infection or heart problems, if this applies.  Monitoring. You may need to be monitored while medicines that you are taking wear off.  Follow these instructions at home: Eating and drinking   Drink enough fluid to keep your urine clear or pale yellow.  Eat a healthy diet and follow instructions from your health care provider about eating or drinking restrictions. A healthy diet includes: ? Fresh fruits and vegetables. ? Whole grains. ? Lean meats. ? Low-fat dairy products.  Eat extra salt only as directed. Do not add extra salt to your diet unless your health care provider told you to do that.  Eat frequent, small meals.  Avoid standing up suddenly after eating. Medicines  Take over-the-counter and prescription medicines only as told by your health care provider. ? Follow instructions from your health care provider about changing the dosage of your current medicines, if this applies. ? Do not stop or adjust any of your medicines on your own. General instructions  Wear compression stockings as told by your health care provider.  Get up slowly from lying down or sitting positions. This gives your blood pressure a chance to adjust.  Avoid hot showers and excessive heat as directed by your health care provider.  Return to your normal activities as told by your health care provider. Ask your health care provider what activities are safe for you.  Do not use any products that contain nicotine or tobacco, such as cigarettes and e-cigarettes. If you need help  quitting, ask your health care provider.  Keep all follow-up visits as told by your health care provider. This is important. Contact a health care provider if:  You vomit.  You have diarrhea.  You have a fever for more than 2-3 days.  You feel more thirsty than usual.  You feel weak and tired. Get help right away if:  You have chest pain.  You have a fast or irregular heartbeat.  You develop numbness in any part of your body.  You cannot move your arms or your legs.  You have trouble speaking.  You become sweaty or feel lightheaded.  You faint.  You feel short of breath.  You have trouble staying awake.  You feel confused. This information is not intended to replace advice given to you by your health care provider. Make sure you discuss any questions you have with your health care provider. Document Released: 07/17/2002 Document Revised: 04/14/2016 Document Reviewed: 01/17/2016 Elsevier Interactive Patient Education  2018 [employer]

## 2017-04-26 ENCOUNTER — Ambulatory Visit
Admission: RE | Admit: 2017-04-26 | Discharge: 2017-04-26 | Disposition: A | Discharge status: Home / Self Care | Payer: BLUE CROSS/BLUE SHIELD | Source: Ambulatory Visit | Attending: Family Medicine | Admitting: Family Medicine

## 2017-04-26 DIAGNOSIS — Z1231 Encounter for screening mammogram for malignant neoplasm of breast: Secondary | ICD-10-CM

## 2017-07-05 ENCOUNTER — Ambulatory Visit: Payer: BLUE CROSS/BLUE SHIELD | Admitting: Internal Medicine

## 2017-07-23 ENCOUNTER — Ambulatory Visit: Payer: BLUE CROSS/BLUE SHIELD | Admitting: Internal Medicine

## 2017-07-23 VITALS — BP 90/52 | HR 64 | Temp 97.9°F | Wt 136.8 lb

## 2017-07-23 DIAGNOSIS — Z23 Encounter for immunization: Secondary | ICD-10-CM

## 2017-07-23 DIAGNOSIS — Z1211 Encounter for screening for malignant neoplasm of colon: Secondary | ICD-10-CM | POA: Diagnosis not present

## 2017-07-23 DIAGNOSIS — E119 Type 2 diabetes mellitus without complications: Secondary | ICD-10-CM

## 2017-07-23 DIAGNOSIS — E785 Hyperlipidemia, unspecified: Secondary | ICD-10-CM | POA: Diagnosis not present

## 2017-07-23 DIAGNOSIS — Z5181 Encounter for therapeutic drug level monitoring: Secondary | ICD-10-CM

## 2017-07-23 LAB — POCT GLYCOSYLATED HEMOGLOBIN (HGB A1C): HEMOGLOBIN A1C: 6.3

## 2017-07-23 NOTE — Patient Instructions (Addendum)
[name],  Your a1c was 6.3 today. Continue metformin 500 mg twice daily with breakfast and dinner. Continue your cholesterol medication nightly.  I will send you a letter with your lab results.  You will get a call about scheduling a colonoscopy.  You are due for a pneumonia vaccine. Please see the handout for more information about this.  Your blood pressure is a little low. Please increase water intake. 64 oz is recommended. 48 oz would be a great start to try for every day.  Best, Dr. Sampson GoonFitzgerald

## 2017-07-24 LAB — COMPREHENSIVE METABOLIC PANEL
ALBUMIN: 4.9 g/dL (ref 3.5–5.5)
ALT: 41 IU/L — ABNORMAL HIGH (ref 0–32)
AST: 25 IU/L (ref 0–40)
Albumin/Globulin Ratio: 2 (ref 1.2–2.2)
Alkaline Phosphatase: 76 IU/L (ref 39–117)
BUN/Creatinine Ratio: 14 (ref 9–23)
BUN: 9 mg/dL (ref 6–24)
Bilirubin Total: 0.6 mg/dL (ref 0.0–1.2)
CALCIUM: 9.9 mg/dL (ref 8.7–10.2)
CO2: 26 mmol/L (ref 20–29)
CREATININE: 0.65 mg/dL (ref 0.57–1.00)
Chloride: 101 mmol/L (ref 96–106)
GFR calc Af Amer: 120 mL/min/{1.73_m2} (ref >59)
GFR, EST NON AFRICAN AMERICAN: 104 mL/min/{1.73_m2} (ref >59)
GLOBULIN, TOTAL: 2.5 g/dL (ref 1.5–4.5)
Glucose: 98 mg/dL (ref 65–99)
Potassium: 4.6 mmol/L (ref 3.5–5.2)
SODIUM: 142 mmol/L (ref 134–144)
Total Protein: 7.4 g/dL (ref 6.0–8.5)

## 2017-07-24 LAB — LIPID PANEL
CHOL/HDL RATIO: 3 ratio (ref 0.0–4.4)
Cholesterol, Total: 145 mg/dL (ref 100–199)
HDL: 48 mg/dL (ref >39)
LDL CALC: 79 mg/dL (ref 0–99)
TRIGLYCERIDES: 90 mg/dL (ref 0–149)
VLDL Cholesterol Cal: 18 mg/dL (ref 5–40)

## 2017-07-26 ENCOUNTER — Encounter: Payer: Self-pay | Admitting: Internal Medicine

## 2017-07-26 NOTE — Assessment & Plan Note (Signed)
-   A1c improved at 6.3 today, compared to 6.4 in August. Seems more motivated to make dietary changes. - Continue statin - Will request eye exam records

## 2017-07-26 NOTE — Progress Notes (Signed)
[facility] Progress Note  Subjective:  Theresa Diaz is a 51 y.o. female with history of diabetes who presents for follow-up.  #Diabetes: - Reports trying to have more regular meals and having more vegetables - Started statin and denies trouble taking this or having muscle cramps - < 4.0 protein on protein/Cr ratio in August - Takes metformin 500 mg BID most days but occasionally only takes once a day - Not getting regular exercise but continues to be active with her work at [employer] - Reports having had normal eye exam at office off [address]  #HM: Due for colonoscopy, pneumococcal 23, TDAP  No Known Allergies  Social History   Tobacco Use  . Smoking status: Never Smoker  . Smokeless tobacco: Never Used  Substance Use Topics  . Alcohol use: No    Objective: Blood pressure (!) 90/52, pulse 64, temperature 97.9 F (36.6 C), weight 136 lb 12.8 oz (62.1 kg), last menstrual period 05/28/2016, SpO2 99 %. Body mass index is 26.72 kg/m. Constitutional: overweight female in NAD HENT: MMM Cardiovascular: RRR, S1, S2, no m/r/g.  Pulmonary/Chest: Effort normal and breath sounds normal.  Neurological: Normal diabetic foot exam.  Vitals reviewed  Assessment/Plan: Diabetes (HCC) - A1c improved at 6.3 today, compared to 6.4 in August. Seems more motivated to make dietary changes. - Continue statin - Will request eye exam records  Recommended increasing fluid intake for lower blood pressure and history of dizziness. Denies dizziness today.  HM: Wanted TDAP. Provided handout on pneumovax for patient to consider. Placed referral for colonoscopy at patient request.    Follow-up in 6 months for next A1c check.  Dani GobbleHillary Abbygayle Helfand, MD [facility], PGY-3

## 2017-08-01 ENCOUNTER — Encounter: Payer: Self-pay | Admitting: Internal Medicine

## 2017-09-27 ENCOUNTER — Encounter: Payer: Self-pay | Admitting: Internal Medicine

## 2017-10-18 ENCOUNTER — Ambulatory Visit: Payer: BLUE CROSS/BLUE SHIELD | Admitting: Internal Medicine

## 2017-10-18 ENCOUNTER — Encounter: Payer: Self-pay | Admitting: Internal Medicine

## 2017-10-18 DIAGNOSIS — M549 Dorsalgia, unspecified: Secondary | ICD-10-CM | POA: Insufficient documentation

## 2017-10-18 DIAGNOSIS — M546 Pain in thoracic spine: Secondary | ICD-10-CM

## 2017-10-18 NOTE — Patient Instructions (Addendum)
It was nice meeting you today [name]!  If your back begins to hurt again, you can take an over the counter anti-inflammatory medicine like ibuprofen or naproxen to help with the pain. You can also try not exercising for a day or two.   If you have any questions or concerns, please feel free to call the clinic.   Be well,  Dr. Natale MilchLancaster

## 2017-10-18 NOTE — Progress Notes (Signed)
   Subjective:   Patient: Theresa Diaz       Birthdate: 04/05/1966       MRN: [id number]      HPI  Theresa Borrowancy Che Hammerschmidt is a 52 y.o. female presenting for back pain.   Back pain Had back pain a couple days ago which she attributes to recent increase in exercise. Has started exericising more, including pull ups. Pain resolved yesterday. Pain was located in thoracic region bilaterally, L>R. She did not take any medication or breaks from exercising, pain just resolved spontaneously. Has no pain or other complaints currently.  Smoking status reviewed. Patient is never smoker.   Review of Systems See HPI.     Objective:  Physical Exam  Constitutional: She is oriented to person, place, and time and well-developed, well-nourished, and in no distress.  HENT:  Head: Normocephalic and atraumatic.  Pulmonary/Chest: Effort normal. No respiratory distress.  Musculoskeletal:  No TTP of back. Full ROM. Normal gait. Able to walk, sit, and stand without difficulty.   Neurological: She is oriented to person, place, and time.  Skin: Skin is warm and dry.  Psychiatric: Affect and judgment normal.      Assessment & Plan:  Back pain Now resolved. Likely 2/2 recent increase in exercise. Resolved spontaneously with medication or rest. Encouraged patient to take NSAIDs or rest from exercising if pain returns. As pain resolved, no further work-up necessary at this time.    Tarri AbernethyAbigail J Elford Evilsizer, MD, MPH PGY-3 [facility] Pager [phone]

## 2017-10-18 NOTE — Assessment & Plan Note (Signed)
Now resolved. Likely 2/2 recent increase in exercise. Resolved spontaneously with medication or rest. Encouraged patient to take NSAIDs or rest from exercising if pain returns. As pain resolved, no further work-up necessary at this time.

## 2017-12-30 ENCOUNTER — Other Ambulatory Visit: Payer: Self-pay

## 2017-12-30 ENCOUNTER — Encounter: Payer: Self-pay | Admitting: Internal Medicine

## 2017-12-30 ENCOUNTER — Ambulatory Visit: Payer: BLUE CROSS/BLUE SHIELD | Admitting: Internal Medicine

## 2017-12-30 VITALS — BP 101/70 | HR 72 | Temp 98.5°F | Wt 137.2 lb

## 2017-12-30 DIAGNOSIS — E119 Type 2 diabetes mellitus without complications: Secondary | ICD-10-CM

## 2017-12-30 DIAGNOSIS — Z23 Encounter for immunization: Secondary | ICD-10-CM | POA: Diagnosis not present

## 2017-12-30 LAB — POCT GLYCOSYLATED HEMOGLOBIN (HGB A1C): HEMOGLOBIN A1C: 6.4 % — AB (ref 4.0–5.6)

## 2017-12-30 MED ORDER — PNEUMOCOCCAL VAC POLYVALENT 25 MCG/0.5ML IJ INJ: 0.5000 mL | INJECTION | INTRAMUSCULAR | Status: DC

## 2017-12-30 MED ORDER — METFORMIN HCL 500 MG PO TABS: 500.0000 mg | ORAL_TABLET | Freq: Two times a day (BID) | ORAL | 2 refills | Status: DC

## 2017-12-30 NOTE — Patient Instructions (Signed)
Ms. Solinger,  Please take metformin twice daily.   Your blood sugar control is good. If you are able to remain active and limit carbohydrates, this will help with blood sugar. You may be able to trial off medications in the future if blood sugar improves.  I like your goal of running 3-4 times a week!  Please call [facility] to schedule colonoscopy. Their number is [phone].  Best, Dr. Sampson Goon

## 2017-12-30 NOTE — Progress Notes (Signed)
[facility] Progress Note  Subjective:  Theresa Diaz is a 52 y.o. female with history of diabetes who presents for blood sugar check-up.  - Has been taking metformin 500 mg BID most days but forgets second dose maybe 3 days a week. - Has started running a mile a few days a week. - Due for pneumococcal 23 vaccine - Still taking lipitor  ROS: No abdominal pain or increased urinary frequency  HM: Had no proteinuria when checked last summer. Had eye exam within the year but records not yet obtained.   No Known Allergies  Social History   Tobacco Use  . Smoking status: Never Smoker  . Smokeless tobacco: Never Used  Substance Use Topics  . Alcohol use: No    Objective: Blood pressure 101/70, pulse 72, temperature 98.5 F (36.9 C), temperature source Oral, weight 137 lb 3.2 oz (62.2 kg), last menstrual period 05/28/2016, SpO2 98 %. Body mass index is 26.8 kg/m. Constitutional: Pleasant, overweight female in NAD Cardiovascular: RRR, S1, S2, no m/r/g.  Pulmonary/Chest: Effort normal and breath sounds normal.  Abdominal: Soft. +BS, NT Neurological: AOx3, no focal deficits. Vitals reviewed  Last 3 A1cs: Today - 6.4 5 months ago - 6.3 9 months ago - 6.4  Assessment/Plan: Diabetes (HCC) - Well controlled with A1c below goal of 6.5 - Continue metformin 500 mg BID - Patient states goal of running 3-4 times a week and limiting carbohydrates - Pneumococcal vaccine administered  HM: Previously referred for colonoscopy but had not heard back. Provided [facility]'s number.   Follow-up in about 6 months for A1c recheck.  Dani Gobble, MD [facility], PGY-3

## 2018-01-02 ENCOUNTER — Encounter: Payer: Self-pay | Admitting: Internal Medicine

## 2018-01-02 NOTE — Assessment & Plan Note (Signed)
-   Well controlled with A1c below goal of 6.5 - Continue metformin 500 mg BID - Patient states goal of running 3-4 times a week and limiting carbohydrates - Pneumococcal vaccine administered

## 2018-02-16 ENCOUNTER — Other Ambulatory Visit: Payer: Self-pay | Admitting: Family Medicine

## 2018-02-16 DIAGNOSIS — Z1231 Encounter for screening mammogram for malignant neoplasm of breast: Secondary | ICD-10-CM

## 2018-04-28 ENCOUNTER — Ambulatory Visit: Payer: BLUE CROSS/BLUE SHIELD

## 2018-04-28 ENCOUNTER — Ambulatory Visit
Admission: RE | Admit: 2018-04-28 | Discharge: 2018-04-28 | Disposition: A | Discharge status: Home / Self Care | Payer: BLUE CROSS/BLUE SHIELD | Source: Ambulatory Visit | Attending: Family Medicine | Admitting: Family Medicine

## 2018-04-28 DIAGNOSIS — Z1231 Encounter for screening mammogram for malignant neoplasm of breast: Secondary | ICD-10-CM

## 2018-05-03 NOTE — Progress Notes (Signed)
Subjective:   Theresa Diaz is a 52 y.o. female with a history of diabetes, abnormal Pap smears here for an annual gynecological exam.   Gyn concerns/Preventative healthcare  Last menstrual period: Patient's last menstrual period was 05/28/2016.  Regular periods: N/A. Did recently have a "."  Last month.  States that it was somewhat pink.  States that she had associated breast tenderness and pelvic pain similar to when she used to have her periods.  Sexually active: no  Contraception or hormonal therapy: n/a  Hx of STD: None. Patient does not desire STD screening  Dyspareunia: No  Hot flashes: No  Vaginal discharge:   Dysuria:No   Last mammogram: 1 week ago (normal, annual screening recommended)  Breast mass or concerns: No, refusing breast exam today   Last pap smear: 04/2016 (postiive HPV, recommended for biopsy but refused)     PMH, Surgical Hx, Family Hx, Social History reviewed and updated as below.  Past Medical History:  Diagnosis Date  . Cholelithiasis 10/2014   Past Surgical History:  Procedure Laterality Date  . CESAREAN SECTION  1999  . CHOLECYSTECTOMY N/A 11/01/2014   Procedure: LAPAROSCOPIC CHOLECYSTECTOMY WITH ATTEMPTED INTRAOPERATIVE CHOLANGIOGRAM;  Surgeon: Frederik SchmidtJay Wyatt, MD;  Location: [facility] OR;  Service: General;  Laterality: N/A;   Family History  Problem Relation Age of Onset  . Diabetes Father   . Hypertension Father   . Breast cancer Neg Hx    Social History   Socioeconomic History  . Marital status: Married    Spouse name: Orlan LeavensShih Seckinger  . Number of children: 2  . Years of education: 612  . Highest education level: Not on file  Occupational History  . Occupation: Retail buyerWaitress    Employer: [employer]  Social Needs  . Financial resource strain: Not on file  . Food insecurity:    Worry: Not on file    Inability: Not on file  . Transportation needs:    Medical: Not on file    Non-medical: Not on file  Tobacco Use  . Smoking status: Never  Smoker  . Smokeless tobacco: Never Used  Substance and Sexual Activity  . Alcohol use: No  . Drug use: No  . Sexual activity: Not Currently    Birth control/protection: Post-menopausal  Lifestyle  . Physical activity:    Days per week: Not on file    Minutes per session: Not on file  . Stress: Not on file  Relationships  . Social connections:    Talks on phone: Not on file    Gets together: Not on file    Attends religious service: Not on file    Active member of club or organization: Not on file    Attends meetings of clubs or organizations: Not on file    Relationship status: Not on file  Other Topics Concern  . Not on file  Social History Narrative   [place] husband Lucius Conn([place], [place]1962) and her two sons Joylene Draft(David Sluka 1999, Phillip Bona 2001).   Works as a Child psychotherapistwaitress, part time.   Speaks [place] and [place].       Cell [phone]    Review of Systems:  Per HPI. Otherwise a complete 10 point ROS was negative.    Objective:   Vitals:   05/04/18 1510  BP: 112/62  Pulse: 78  Temp: 98.7 F (37.1 C)  SpO2: 96%   Exam: General: well appearing, NAD. HEENT: NCAT. Cardiovascular: RRR. No murmurs, rubs, or gallops. Respiratory: CTAB.  No rales, rhonchi, or wheeze. Abdomen: soft, nontender, nondistended. Extremities: warm, well perfused. No LE edema. Skin: Warm, dry, intact. Neuro: No focal deficits.  Pelvic Exam:        External: normal female genitalia without lesions or masses        Vagina: normal without lesions or masses        Cervix: lesion noted at 1 o'clock and 11 o'clock region. Small polyp noted at 5 o'clock region with some bleeding during Pap smear        Pap smear: performed        Samples for Wet prep obtained    Chemistry      Component Value Date/Time   NA 142 07/23/2017 1112   K 4.6 07/23/2017 1112   CL 101 07/23/2017 1112   CO2 26 07/23/2017 1112   BUN 9 07/23/2017 1112   CREATININE 0.65 07/23/2017 1112   CREATININE 0.65  05/28/2016 1606      Component Value Date/Time   CALCIUM 9.9 07/23/2017 1112   ALKPHOS 76 07/23/2017 1112   AST 25 07/23/2017 1112   ALT 41 (H) 07/23/2017 1112   BILITOT 0.6 07/23/2017 1112      Lab Results  Component Value Date   WBC 6.1 12/23/2015   HGB 15.0 12/23/2015   HCT 44.8 12/23/2015   MCV 86.0 12/23/2015   PLT 356 12/23/2015   Lab Results  Component Value Date   TSH 0.95 12/23/2015   Lab Results  Component Value Date   HGBA1C 6.4 (A) 12/30/2017   Assessment/Plan:   Post-menopausal bleeding Patient with postmenopausal bleeding.  Discussed work-up by either GYN clinic here or at referral to gynecology.  Patient will likely need ultrasound and endometrial biopsy.  Patient requesting referral to gynecology.  Stressed importance of following up with gynecology -Follow-up as needed [facility] -Ambulatory referral to gynecology  Preventative health care Pap smear obtained today.  Will test for high risk HPV 16, 18, 25 as well.  Wet Prep negative, patient informed during visit. Baseline lab work of CBC, CMP, lipid panel obtained today Flu vaccine given today Amatory referral to gastroenterology for colonoscopy placed today.  Discussed patient with Dr. Jennette Kettle  Return in about 4 weeks (around 06/01/2018).   Oralia Manis, DO, PGY-2

## 2018-05-04 ENCOUNTER — Encounter: Payer: Self-pay | Admitting: Family Medicine

## 2018-05-04 ENCOUNTER — Other Ambulatory Visit: Payer: Self-pay

## 2018-05-04 ENCOUNTER — Ambulatory Visit: Payer: BLUE CROSS/BLUE SHIELD | Admitting: Family Medicine

## 2018-05-04 ENCOUNTER — Other Ambulatory Visit (HOSPITAL_COMMUNITY)
Admission: RE | Admit: 2018-05-04 | Discharge: 2018-05-04 | Disposition: A | Discharge status: Home / Self Care | Payer: BLUE CROSS/BLUE SHIELD | Source: Ambulatory Visit | Attending: Family Medicine | Admitting: Family Medicine

## 2018-05-04 VITALS — BP 112/62 | HR 78 | Temp 98.7°F | Ht 60.0 in | Wt 136.6 lb

## 2018-05-04 DIAGNOSIS — Z23 Encounter for immunization: Secondary | ICD-10-CM | POA: Diagnosis not present

## 2018-05-04 DIAGNOSIS — Z1211 Encounter for screening for malignant neoplasm of colon: Secondary | ICD-10-CM

## 2018-05-04 DIAGNOSIS — N898 Other specified noninflammatory disorders of vagina: Secondary | ICD-10-CM

## 2018-05-04 DIAGNOSIS — Z124 Encounter for screening for malignant neoplasm of cervix: Secondary | ICD-10-CM | POA: Insufficient documentation

## 2018-05-04 DIAGNOSIS — N950 Postmenopausal bleeding: Secondary | ICD-10-CM | POA: Insufficient documentation

## 2018-05-04 DIAGNOSIS — E118 Type 2 diabetes mellitus with unspecified complications: Secondary | ICD-10-CM | POA: Diagnosis not present

## 2018-05-04 DIAGNOSIS — Z0000 Encounter for general adult medical examination without abnormal findings: Secondary | ICD-10-CM

## 2018-05-04 HISTORY — DX: Postmenopausal bleeding: N95.0

## 2018-05-04 LAB — POCT WET PREP (WET MOUNT)
CLUE CELLS WET PREP WHIFF POC: NEGATIVE
Trichomonas Wet Prep HPF POC: ABSENT

## 2018-05-04 NOTE — Assessment & Plan Note (Signed)
Pap smear obtained today.  Will test for high risk HPV 16, 18, 25 as well.  Wet Prep negative, patient informed during visit. Baseline lab work of CBC, CMP, lipid panel obtained today Flu vaccine given today Amatory referral to gastroenterology for colonoscopy placed today.

## 2018-05-04 NOTE — Assessment & Plan Note (Signed)
Patient with postmenopausal bleeding.  Discussed work-up by either GYN clinic here or at referral to gynecology.  Patient will likely need ultrasound and endometrial biopsy.  Patient requesting referral to gynecology.  Stressed importance of following up with gynecology -[facility] as needed [facility] -Ambulatory referral to gynecology

## 2018-05-04 NOTE — Patient Instructions (Signed)
It was a pleasure seeing you today.   Today we discussed your annual exam and pap smear   For your pap smear: I will either call or send a letter with results   Please follow up in 1 month for diabetes or sooner if symptoms persist or worsen. Please call the clinic immediately if you have any concerns.   Our clinic's number is [phone]. Please call with questions or concerns.    Thank you,  Oralia Manis, DO

## 2018-05-05 LAB — CBC
Hematocrit: 40.7 % (ref 34.0–46.6)
Hemoglobin: 13.6 g/dL (ref 11.1–15.9)
MCH: 28.3 pg (ref 26.6–33.0)
MCHC: 33.4 g/dL (ref 31.5–35.7)
MCV: 85 fL (ref 79–97)
PLATELETS: 305 10*3/uL (ref 150–450)
RBC: 4.8 x10E6/uL (ref 3.77–5.28)
RDW: 13.2 % (ref 12.3–15.4)
WBC: 6.8 10*3/uL (ref 3.4–10.8)

## 2018-05-05 LAB — COMPREHENSIVE METABOLIC PANEL
ALBUMIN: 4.3 g/dL (ref 3.5–5.5)
ALT: 17 IU/L (ref 0–32)
AST: 18 IU/L (ref 0–40)
Albumin/Globulin Ratio: 1.7 (ref 1.2–2.2)
Alkaline Phosphatase: 74 IU/L (ref 39–117)
BUN/Creatinine Ratio: 20 (ref 9–23)
BUN: 12 mg/dL (ref 6–24)
CHLORIDE: 105 mmol/L (ref 96–106)
CO2: 24 mmol/L (ref 20–29)
Calcium: 9.5 mg/dL (ref 8.7–10.2)
Creatinine, Ser: 0.6 mg/dL (ref 0.57–1.00)
GFR calc non Af Amer: 107 mL/min/{1.73_m2} (ref >59)
GFR, EST AFRICAN AMERICAN: 123 mL/min/{1.73_m2} (ref >59)
Globulin, Total: 2.6 g/dL (ref 1.5–4.5)
Glucose: 133 mg/dL — ABNORMAL HIGH (ref 65–99)
POTASSIUM: 3.6 mmol/L (ref 3.5–5.2)
SODIUM: 143 mmol/L (ref 134–144)
TOTAL PROTEIN: 6.9 g/dL (ref 6.0–8.5)

## 2018-05-05 LAB — LIPID PANEL
Chol/HDL Ratio: 2.8 ratio (ref 0.0–4.4)
Cholesterol, Total: 131 mg/dL (ref 100–199)
HDL: 47 mg/dL (ref >39)
LDL Calculated: 54 mg/dL (ref 0–99)
Triglycerides: 148 mg/dL (ref 0–149)
VLDL Cholesterol Cal: 30 mg/dL (ref 5–40)

## 2018-05-06 LAB — CYTOLOGY - PAP
Diagnosis: NEGATIVE
HPV: DETECTED — AB

## 2018-05-12 ENCOUNTER — Encounter: Payer: Self-pay | Admitting: Gastroenterology

## 2018-05-13 ENCOUNTER — Telehealth: Payer: Self-pay

## 2018-05-13 NOTE — Telephone Encounter (Signed)
Patient returning call to PCP about results.  Call back [phone].  Ples Specter, RN [facility] RN)

## 2018-05-13 NOTE — Telephone Encounter (Signed)
Attempted to call patient to discuss lab results but no answer x2.  Voicemail left x2 to call clinic back to discuss these results. 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  Patient will need colposcopy.  Per messages from Dr. Seymour Bars and Riley Nearing, RMA, patient will have colposcopy scheduled for 05/16/2018 at 9 AM at OB/GYN.  If patient calls back please inform her of these results and that colposcopy scheduled for 05/16/2018 at 9 AM at OB/GYN office.  Orpah Clinton, PGY-2 [facility] 05/13/2018 5:43 PM

## 2018-05-16 ENCOUNTER — Encounter: Payer: Self-pay | Admitting: Obstetrics & Gynecology

## 2018-05-16 ENCOUNTER — Ambulatory Visit: Payer: BLUE CROSS/BLUE SHIELD | Admitting: Obstetrics & Gynecology

## 2018-05-16 VITALS — BP 150/94 | Ht 60.0 in | Wt 131.0 lb

## 2018-05-16 DIAGNOSIS — B977 Papillomavirus as the cause of diseases classified elsewhere: Secondary | ICD-10-CM | POA: Diagnosis not present

## 2018-05-16 DIAGNOSIS — N870 Mild cervical dysplasia: Secondary | ICD-10-CM | POA: Diagnosis not present

## 2018-05-16 DIAGNOSIS — Z113 Encounter for screening for infections with a predominantly sexual mode of transmission: Secondary | ICD-10-CM

## 2018-05-16 DIAGNOSIS — N72 Inflammatory disease of cervix uteri: Secondary | ICD-10-CM

## 2018-05-16 NOTE — Patient Instructions (Signed)
1. 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  High-risk HPV was positive in 2016.  No previous abnormal Pap test.  Counseling on colposcopy done.  Colposcopy findings reviewed with patient.  Management per results of HPV 16/18/45 and cervical biopsies.  2. Screen for STD (sexually transmitted disease) - Gono-Chlam  - HIV antibody (with reflex) - RPR - Hepatitis C Antibody - Hepatitis B Surface AntiGEN  Harriett Sine, it was a pleasure meeting you today!  I will inform you of your results as soon as they are available.

## 2018-05-16 NOTE — Addendum Note (Signed)
Addended by: Dayna Barker on: 05/16/2018 10:29 AM   Modules accepted: Orders

## 2018-05-16 NOTE — Progress Notes (Signed)
    Ilham Roughton Strege Jan 24, 1966 [id number]        52 y.o.  G2P2L2 Married   RP: HR HPV positive for Colposcopy  HPI: Pap test negative, HPV HR positive 05/04/2018.  Had Normal Paps before, but HR HPV was done and positive in 2016.  No pelvic pain.  No abnormal vaginal discharge.  Postmenopausal on no hormone replacement therapy with no postmenopausal bleeding.   OB History  Gravida Para Term Preterm AB Living  2 2       2   SAB TAB Ectopic Multiple Live Births          2    # Outcome Date GA Lbr Len/2nd Weight Sex Delivery Anes PTL Lv  2 Para           1 Para             Past medical history,surgical history, problem list, medications, allergies, family history and social history were all reviewed and documented in the EPIC chart.   Directed ROS with pertinent positives and negatives documented in the history of present illness/assessment and plan.  Exam:  Vitals:   05/16/18 0853  BP: (!) 150/94  Weight: 131 lb (59.4 kg)  Height: 5' (1.524 m)   General appearance:  Normal  Colposcopy Procedure Note Latia Mataya 05/16/2018  Indications: HPV HR positive  Procedure Details  The risks and benefits of the procedure and Verbal informed consent obtained.  Speculum placed in vagina and excellent visualization of cervix achieved, cervix swabbed x 3 with acetic acid solution.  Findings:  Cervix colposcopy: Physical Exam  Genitourinary:     Vaginal colposcopy: Normal  Vulvar colposcopy: Grossly normal  Perirectal colposcopy: Grossly normal  The cervix was sprayed with Hurricane before performing the cervical biopsies.  Specimens: HPV 16-18-45.  Gono-Chlam.  Cervical Bxs at 12 and 4 O'clock  Complications: None, hemostasis with Silver Nitrate . Plan:  Pending labs/Cervical Bxs, management per results    Assessment/Plan:  52 y.o. G2P2   1. 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  High-risk HPV was  positive in 2016.  No previous abnormal Pap test.  Counseling on colposcopy done.  Colposcopy findings reviewed with patient.  Management per results of HPV 16/18/45 and cervical biopsies.  2. Screen for STD (sexually transmitted disease) - Gono-Chlam  - HIV antibody (with reflex) - RPR - Hepatitis C Antibody - Hepatitis B Surface AntiGEN  Counseling on above issues and coordination of care more than 50% for 20 minutes  Genia Del MD, 9:13 AM 05/16/2018

## 2018-05-17 ENCOUNTER — Telehealth: Payer: Self-pay | Admitting: *Deleted

## 2018-05-17 LAB — HIV ANTIBODY (ROUTINE TESTING W REFLEX): HIV 1&2 Ab, 4th Generation: NONREACTIVE

## 2018-05-17 LAB — HEPATITIS C ANTIBODY
Hepatitis C Ab: NONREACTIVE
SIGNAL TO CUT-OFF: 0.03 (ref <1.00)

## 2018-05-17 LAB — RPR: RPR Ser Ql: NONREACTIVE

## 2018-05-17 LAB — C. TRACHOMATIS/N. GONORRHOEAE RNA
C. TRACHOMATIS RNA, TMA: NOT DETECTED
N. GONORRHOEAE RNA, TMA: NOT DETECTED

## 2018-05-17 LAB — HEPATITIS B SURFACE ANTIGEN: Hepatitis B Surface Ag: NONREACTIVE

## 2018-05-17 NOTE — Telephone Encounter (Signed)
LMOVM informing pt that result was negative. Deseree Bruna Potter, CMA

## 2018-05-17 NOTE — Telephone Encounter (Signed)
-----   Message from Oralia Manis, DO sent at 05/17/2018 12:06 PM EDT ----- Please inform patient that syphilis testing is negative

## 2018-05-17 NOTE — Telephone Encounter (Signed)
Pt returned call and was informed.  Theresa Diaz, CMA  

## 2018-05-18 LAB — PATHOLOGY

## 2018-05-18 LAB — TISSUE PATH REPORT

## 2018-05-19 LAB — HPV TYPE 16 AND 18/45 RNA
HPV Type 16 RNA: NOT DETECTED
HPV Type 18/45 RNA: DETECTED — AB

## 2018-06-01 ENCOUNTER — Other Ambulatory Visit: Payer: Self-pay | Admitting: Internal Medicine

## 2018-06-01 DIAGNOSIS — E785 Hyperlipidemia, unspecified: Secondary | ICD-10-CM

## 2018-06-16 ENCOUNTER — Ambulatory Visit (AMBULATORY_SURGERY_CENTER): Payer: Self-pay | Admitting: *Deleted

## 2018-06-16 ENCOUNTER — Encounter: Payer: Self-pay | Admitting: Gastroenterology

## 2018-06-16 VITALS — Ht 60.0 in | Wt 133.0 lb

## 2018-06-16 DIAGNOSIS — Z1211 Encounter for screening for malignant neoplasm of colon: Secondary | ICD-10-CM

## 2018-06-16 MED ORDER — NA SULFATE-K SULFATE-MG SULF 17.5-3.13-1.6 GM/177ML PO SOLN: ORAL | 0 refills | Status: DC

## 2018-06-16 NOTE — Progress Notes (Signed)
Interpreter Patent attorney) with patient during PV today. Patient denies any allergies to eggs or soy. Patient denies any problems with anesthesia/sedation. Patient denies any oxygen use at home. Patient denies taking any diet/weight loss medications or blood thinners. EMMI education assisgned to patient on colonoscopy, this was explained and instructions given to patient.

## 2018-06-21 ENCOUNTER — Ambulatory Visit: Payer: BLUE CROSS/BLUE SHIELD | Admitting: Family Medicine

## 2018-06-30 ENCOUNTER — Encounter: Payer: BLUE CROSS/BLUE SHIELD | Admitting: Gastroenterology

## 2018-09-27 ENCOUNTER — Ambulatory Visit: Payer: BLUE CROSS/BLUE SHIELD | Admitting: Obstetrics & Gynecology

## 2018-10-31 ENCOUNTER — Other Ambulatory Visit: Payer: Self-pay

## 2018-11-02 ENCOUNTER — Ambulatory Visit: Payer: BLUE CROSS/BLUE SHIELD | Admitting: Obstetrics & Gynecology

## 2018-12-20 ENCOUNTER — Other Ambulatory Visit: Payer: Self-pay

## 2018-12-20 ENCOUNTER — Ambulatory Visit: Payer: Self-pay | Admitting: Obstetrics & Gynecology

## 2019-03-24 ENCOUNTER — Other Ambulatory Visit: Payer: Self-pay | Admitting: Family Medicine

## 2019-03-24 DIAGNOSIS — Z1231 Encounter for screening mammogram for malignant neoplasm of breast: Secondary | ICD-10-CM

## 2019-03-29 NOTE — Progress Notes (Deleted)
Subjective:  CC -- Annual Physical; With complaints of ***  Pt reports she ***   Cardiovascular: - Risk as of ***(date): *** (assessment every 3-5 years) The 10-year ASCVD risk score Mikey Bussing DC Jr., et al., 2013) is: 2.5%   Values used to calculate the score:     Age: 53 years     Sex: Female     Is Non-Hispanic African American: No     Diabetic: Yes     Tobacco smoker: No     Systolic Blood Pressure: 063 mmHg     Is BP treated: No     HDL Cholesterol: 47 mg/dL     Total Cholesterol: 131 mg/dL - Dx Hypertension: no  - Dx Hyperlipidemia: yes, Patient on atorvastatin 10 mg (once age 39-21; high risk >35yo, low risk >45)*** - Dx Obesity: {YES/NO/WILD CARDS:18581} (Class I BMI <34.9, Class II <39.9, Class III < 49.9)*** - Physical Activity: {YES/NO/WILD KZSWF:09323}  - Diabetes: yes, Patient with a history of diabetes, controlled on metformin 500 mg twice daily.  Last A1c from 12/30/2017 was 6.4 (age 67-70 w/ BMI >25, or HTN, or HLD) *** (A1c >6.5, or classic Sxs w/ random CBG >200, or FBG >126 (fasting >8hr))***  Cancer: Colorectal >> Colonoscopy: {YES/NO/WILD FTDDU:20254} (Risk factors?, W/o risk factors > 50yo)*** Lung >> Tobacco Use: {YES/NO/WILD YHCWC:37628} (age 34-74 w/ >62yr/pack/yr Hx, unless quit >29yr ago) ***  - If so, previous Low-Dose CT screen: {YES/NO/WILD BTDVV:61607}  Breast >> Mammogram: {YES/NO/WILD CARDS:18581} (>40yo to be discussed; Q23yrs)*** Cervical/Endometrial >>  - Postmenopausal: {YES/NO/WILD PXTGG:26948}  - Vaginal Bleeding: {YES/NO/WILD NIOEV:03500} - Pap Smear: {YES/NO/WILD XFGHW:29937}   - Previous Abnormal Pap: {YES/NO/WILD CARDS:18581} (21-29yo Q10yrs; >30yo Q62yr w/ neg HPV)*** Skin >> Suspicious lesions: {YES/NO/WILD JIRCV:89381}   Social: Alcohol Use: {YES/NO/WILD OFBPZ:02585}  Tobacco Use: {YES/NO/WILD IDPOE:42353}   - Interested in Quitting: {YES/NO/WILD IRWER:15400}  Other Drugs: {YES/NO/WILD QQPYP:95093}  Risky Sexual Behavior: {YES/NO/WILD  CARDS:18581}  - Chlamydia: <25yo, or >25yo and incr risk - Gonorrhea: sexually active <25yo, or at increased risk - Syphilis: If at increased risk - HIV: All individuals (15-18yo once, then annually if risks are high) >> should be checked anytime STDs are checked. - Hep C: once if born in [place] between 1945-1965; or at increased risk - Hep B: If at increased risk*** Depression: {YES/NO/WILD CARDS:18581}   - PHQ9 score:  Support and Life at Home: {YES/NO/WILD OIZTI:45809}   Other: Osteoporosis: {YES/NO/WILD CARDS:18581} (postmenopausal women <65yo with risk factors -- do BMD screen)*** Zoster Vaccine: {YES/NO/WILD XIPJA:25053} (those >50yo)*** Flu Vaccine: {YES/NO/WILD ZJQBH:41937}  Pneumonia Vaccine: {YES/NO/WILD TKWIO:97353} (those w/ risk factors) - Both: Immunocompromised, cochlear implant, CSF leak, asplenic, sickle cell, CKD - PPSV-23 only: Heart dz, lung disease, DM, tobacco abuse, alcoholism, cirrhosis/liver disease.***   ROS-  Past Medical History Patient Active Problem List   Diagnosis Date Noted  . Post-menopausal bleeding 05/04/2018  . Back pain 10/18/2017  . Cubital tunnel syndrome on left 12/31/2016  . Strain of latissimus dorsi muscle 08/05/2016  . Diabetes ([place]) 06/01/2016  . Abnormal Pap smear of cervix 05/05/2016  . Carpal tunnel syndrome 01/13/2016  . Dizziness 12/23/2015  . Pain in joint, shoulder region 09/18/2015  . Knee pain, bilateral 02/05/2015  . Hemorrhoids 10/09/2014  . Perimenopausal 04/04/2013  . Stress incontinence, female 12/26/2012  . Preventative health care 12/18/2010    Medications- reviewed and updated Current Outpatient Medications  Medication Sig Dispense Refill  . atorvastatin (LIPITOR) 10 MG tablet TAKE 1 TABLET BY MOUTH EVERY DAY  90 tablet 3  . fish oil-omega-3 fatty acids 1000 MG capsule Take 1 g by mouth daily. Reported on 09/18/2015    . metFORMIN (GLUCOPHAGE) 500 MG tablet Take 1 tablet (500 mg total) by mouth 2 (two) times daily  with a meal. 180 tablet 2  . Na Sulfate-K Sulfate-Mg Sulf 17.5-3.13-1.6 GM/177ML SOLN Suprep (no substitutions)-TAKE AS DIRECTED. 354 mL 0  . naproxen (NAPROSYN) 500 MG tablet Take 1 tablet (500 mg total) by mouth 2 (two) times daily as needed for moderate pain. 30 tablet 0   No current facility-administered medications for this visit.     Objective: LMP 05/28/2016  Gen: NAD, alert, cooperative with exam*** HEENT: NCAT, EOMI, PERRL CV: RRR, good S1/S2, no murmur Resp: CTABL, no wheezes, non-labored Abd: Soft, Non Tender, Non Distended, BS present, no guarding or organomegaly Genital Exam: {genitourinary exam:311642::"not done"} Ext: No edema, warm Neuro: Alert and oriented, No gross deficits   Assessment/Plan:  No problem-specific Assessment & Plan notes found for this encounter.   No orders of the defined types were placed in this encounter.   No orders of the defined types were placed in this encounter.    Oralia ManisSherin Danylle Ouk, DO, PGY-3 03/29/2019 11:08 AM

## 2019-03-30 ENCOUNTER — Other Ambulatory Visit: Payer: Self-pay

## 2019-03-30 ENCOUNTER — Ambulatory Visit: Payer: Self-pay | Admitting: Family Medicine

## 2019-05-18 ENCOUNTER — Encounter: Payer: BLUE CROSS/BLUE SHIELD | Admitting: Obstetrics & Gynecology

## 2019-09-28 IMAGING — MG DIGITAL SCREENING BILATERAL MAMMOGRAM WITH TOMO AND CAD
6 of 10 series · 6 of 30 positions shown · non-contrast
Comparison: Previous exam(s).

CLINICAL DATA: Screening.

EXAM:
DIGITAL SCREENING BILATERAL MAMMOGRAM WITH TOMO AND CAD

[L CC synth-2D]
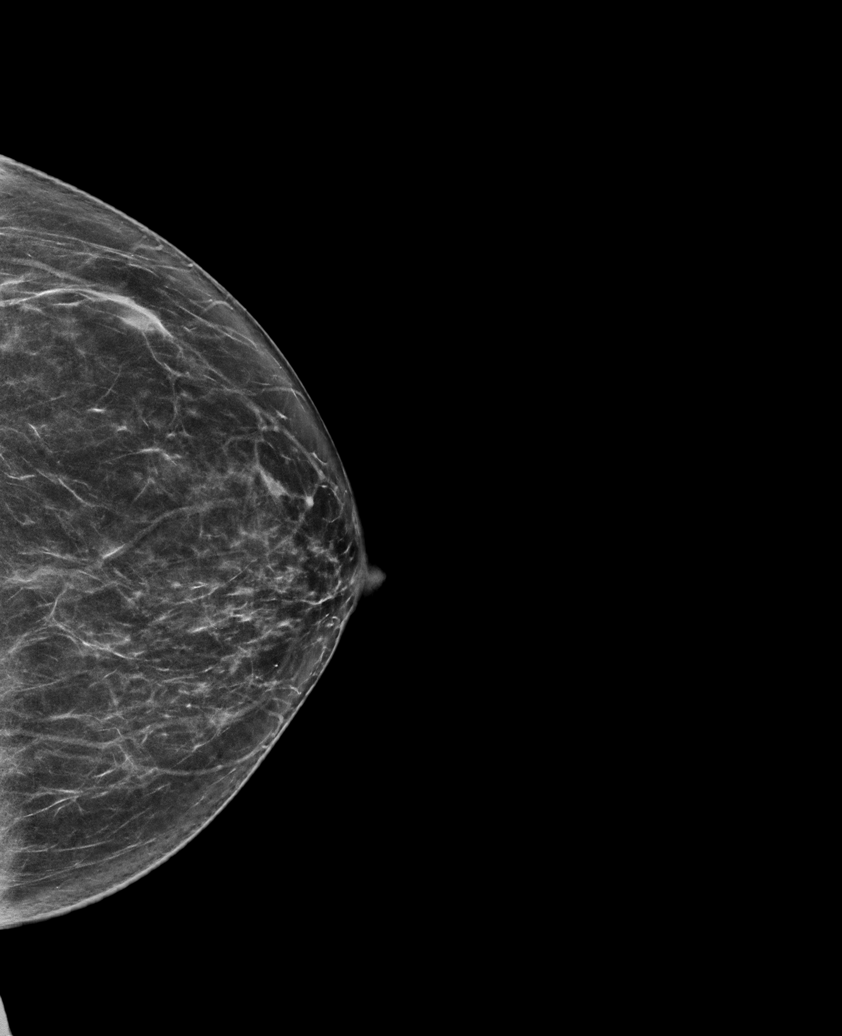

[R MLO synth-2D]
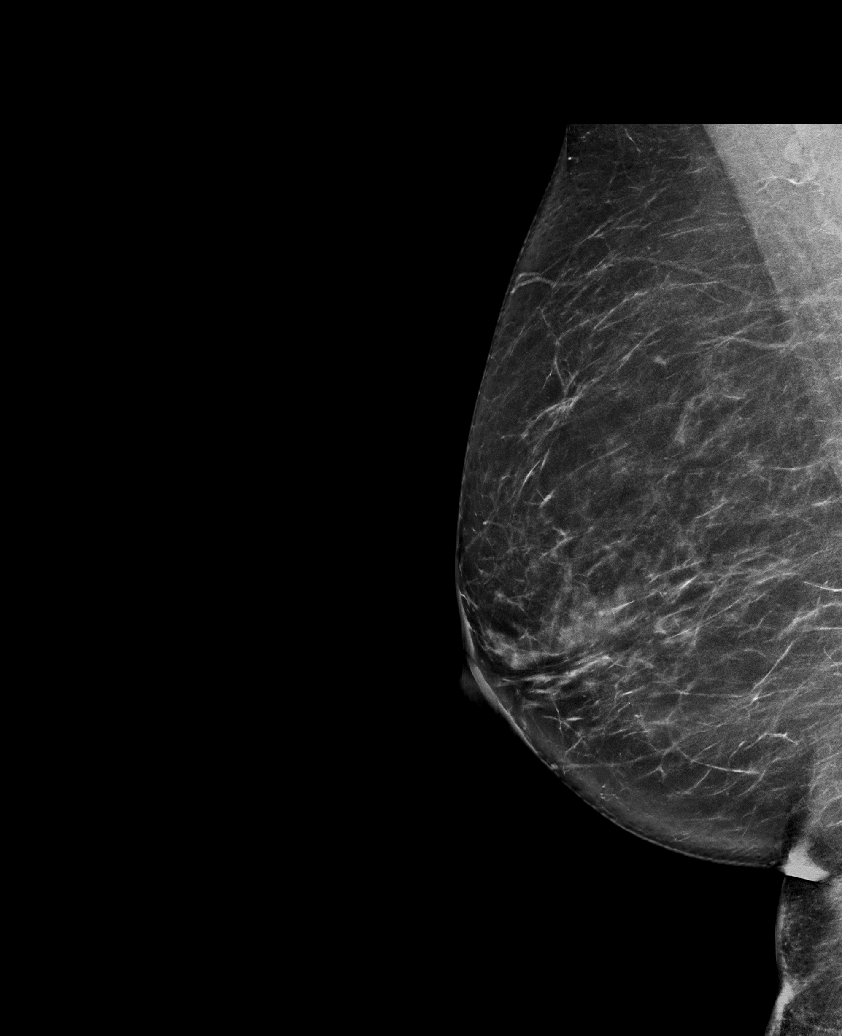

[R CC synth-2D (1 of 2)]
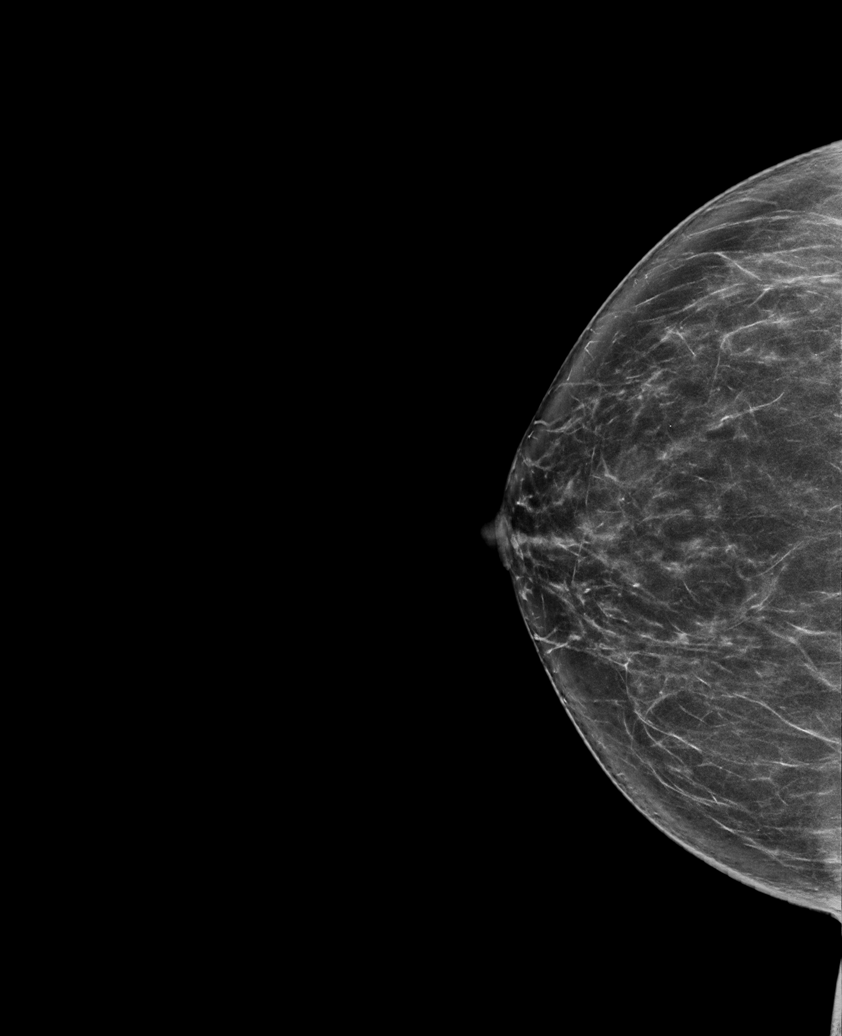

[R CC synth-2D (2 of 2)]
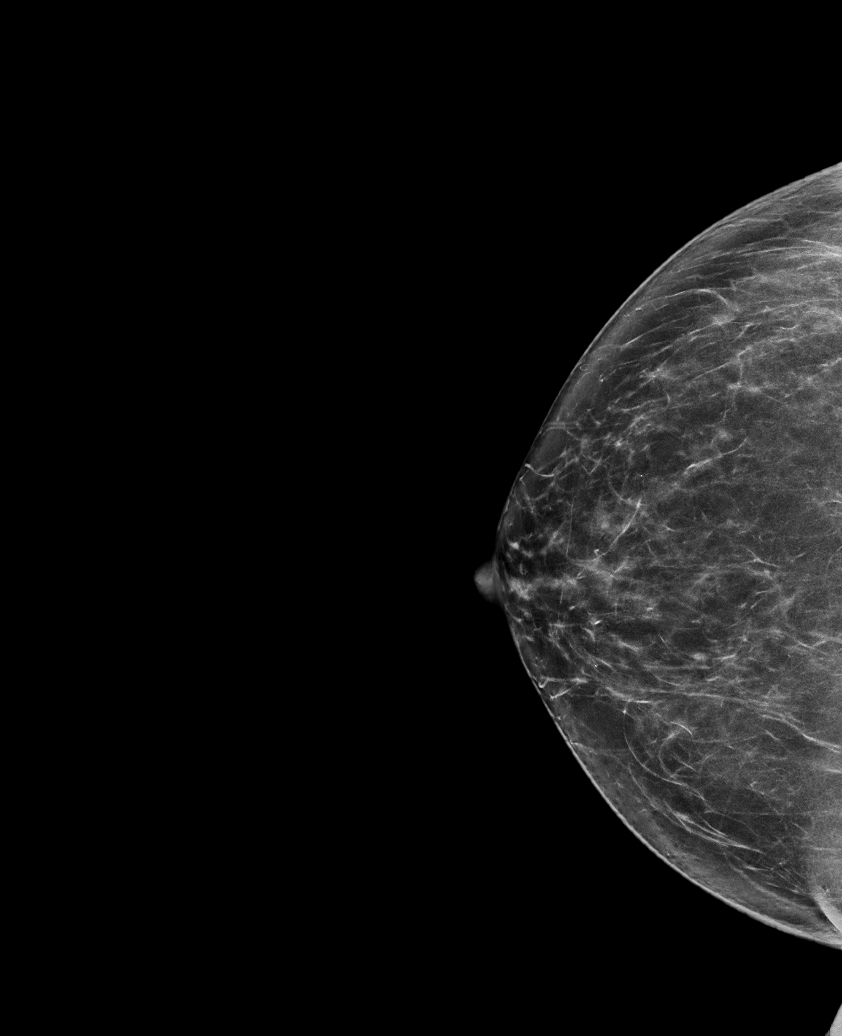

[L MLO synth-2D]
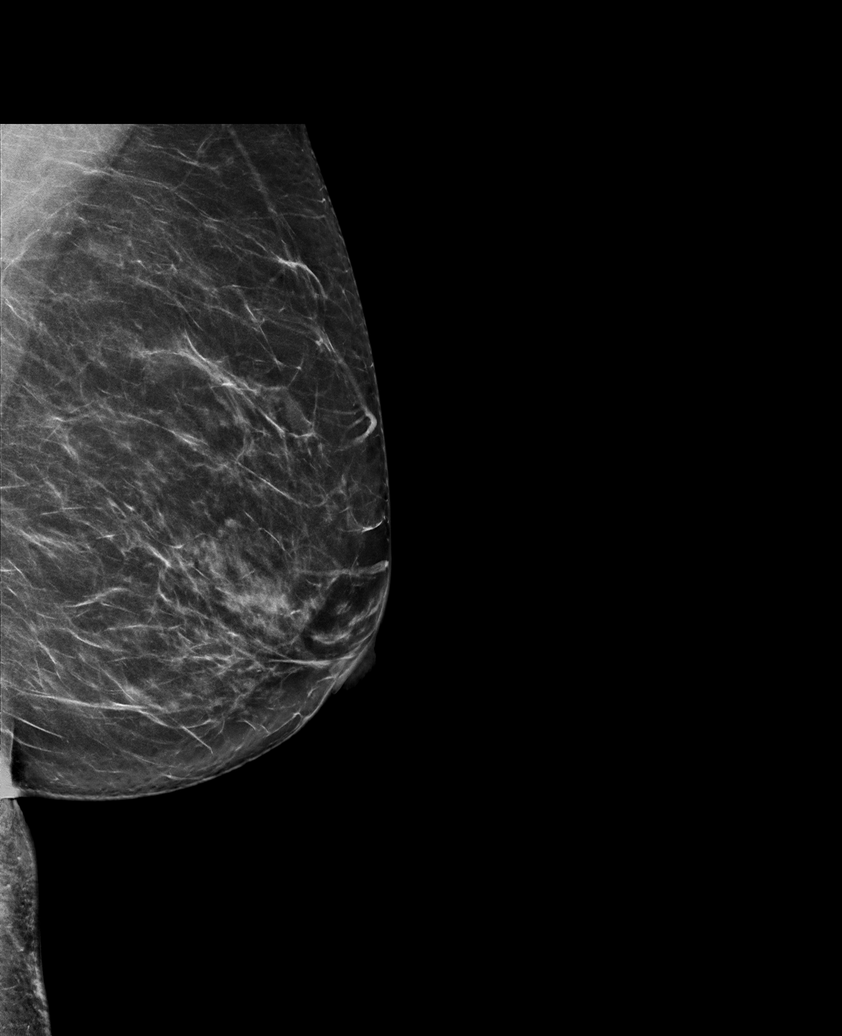

[R MLO tomo · tomo slice 41/82.0]
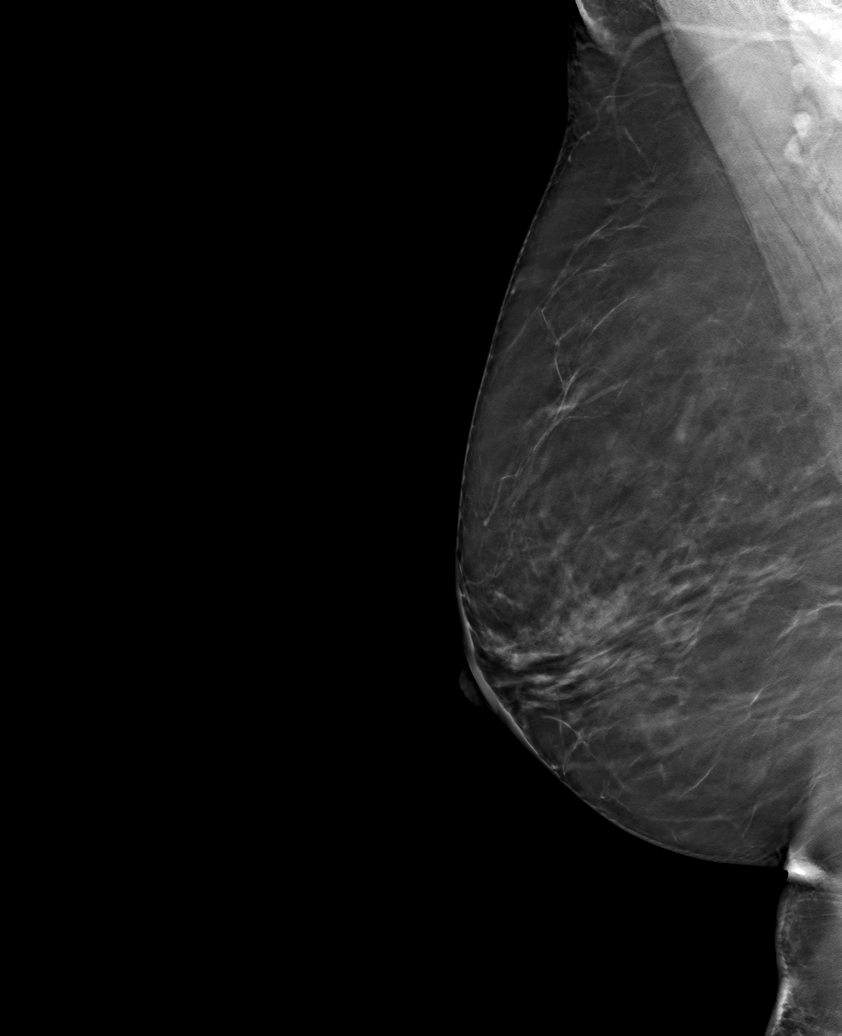

[6 of 30 positions shown; findings below may reference images not displayed]

ACR Breast Density Category b: There are scattered areas of
fibroglandular density.
FINDINGS: There are no findings suspicious for malignancy. Images were
processed with CAD.
IMPRESSION: No mammographic evidence of malignancy. A result letter of this
screening mammogram will be mailed directly to the patient.

RECOMMENDATION:
Screening mammogram in one year. (Code:[id number])

BI-RADS CATEGORY  1: Negative.

## 2019-09-29 ENCOUNTER — Ambulatory Visit: Payer: Self-pay

## 2019-09-29 ENCOUNTER — Ambulatory Visit: Payer: Self-pay | Admitting: Family Medicine

## 2019-09-29 NOTE — Progress Notes (Deleted)
   CHIEF COMPLAINT / HPI:  HPV -follow up  DM -A1c -bmp -foot -urine  HLD    PERTINENT  PMH / PSH: ***   OBJECTIVE: LMP 05/28/2016   ***  ASSESSMENT / PLAN:  No problem-specific Assessment & Plan notes found for this encounter.     Theresa Mo, MD [facility]

## 2019-10-16 NOTE — Progress Notes (Signed)
    SUBJECTIVE:   CHIEF COMPLAINT / HPI:  Diabetes  Fasting checks: does not check  Post prandial does not check   Compliance: only taking metformin once daily  Diet: doesn't eat much sugar, on occasion uses splenda  Exercise: yes, goes to a fitness class, walking  Eye exam: due  Foot exam: due, will complete today  A1C: 6.3 Symptoms: no symptoms of hypoglycemia. no symptoms of  polyuria, polydipsia. no numbness in extremities, and no foot ulcers/trauma Meds: metformin 500mg  bid  Pneumonia vaccine: UTD Urine micro albumin:creatine ratio: will obtain today   Monitoring Labs and Parameters Last A1C:  Lab Results  Component Value Date   HGBA1C 6.3 10/17/2019   Last Lipid:     Component Value Date/Time   CHOL 131 05/04/2018 1614   HDL 47 05/04/2018 1614   LDLDIRECT 137 (H) 10/08/2011 1701   Last Bmet  Potassium  Date Value Ref Range Status  05/04/2018 3.6 3.5 - 5.2 mmol/L Final   Sodium  Date Value Ref Range Status  05/04/2018 143 134 - 144 mmol/L Final   Creat  Date Value Ref Range Status  05/28/2016 0.65 0.50 - 1.10 mg/dL Final   Creatinine, Ser  Date Value Ref Range Status  05/04/2018 0.60 0.57 - 1.00 mg/dL Final     Hyperlipidemia Meds: atorvastatin 10mg   Diet: see above  Exercise: see above    PERTINENT  PMH / PSH: diabetes  OBJECTIVE:   BP 128/72   Pulse 71   Wt 131 lb 9.6 oz (59.7 kg)   LMP 05/28/2016   SpO2 98%   BMI 25.70 kg/m   Gen: awake and alert, NAD Cardio: RRR, no MRG Resp: CTAB, no wheezes, rales, or rhonchi GI: soft, non tender, non distended Ext: no edema Foot: no deformities, no ulcerations, no other skin breakdown bilaterally.  Intact to touch and monofilament testing bilaterally.  PT and DP pulses Intact bilateral.  ASSESSMENT/PLAN:   Diabetes (HCC) Controlled with A1c of 6.3.  Patient has only been taking Metformin once a day.  I advised her to continue this unchanged prescription in EMR.  Patient to continue healthy diet  and daily exercise.  I congratulated patient on maintaining a low A1c.  Foot exam updated today.  Patient will obtain eye exam within this year and referral has been placed.  Will obtain baseline labs including lipid panel, CBC, BMP.  Up-to-date on pneumonia vaccine.  Will obtain urine microalbumin creatinine ratio today as well.  Rx for glucometer sent to patient so that she may have this at home in case of hypoglycemic episodes however unlikely given use of Metformin.  Hyperlipidemia associated with type 2 diabetes mellitus (HCC) Lipid panel obtained today.  Continue atorvastatin.  Continue healthy diet and daily exercise  Preventative health care Referral to GI placed for colonoscopy.  Patient diet information in AVS as well for phone numbers for GI so she may call to schedule appointment.  Flu vaccine obtained at [facility] on October 16 so will update EMR.  Baseline labs obtained today including CBC, BMP, lipid panel.     05/30/2016, DO  PGY-3 [facility]

## 2019-10-17 ENCOUNTER — Other Ambulatory Visit: Payer: Self-pay

## 2019-10-17 ENCOUNTER — Ambulatory Visit (INDEPENDENT_AMBULATORY_CARE_PROVIDER_SITE_OTHER): Payer: 59 | Admitting: Family Medicine

## 2019-10-17 ENCOUNTER — Other Ambulatory Visit: Payer: Self-pay | Admitting: Family Medicine

## 2019-10-17 VITALS — BP 128/72 | HR 71 | Wt 131.6 lb

## 2019-10-17 DIAGNOSIS — E1169 Type 2 diabetes mellitus with other specified complication: Secondary | ICD-10-CM

## 2019-10-17 DIAGNOSIS — E119 Type 2 diabetes mellitus without complications: Secondary | ICD-10-CM | POA: Diagnosis not present

## 2019-10-17 DIAGNOSIS — Z0000 Encounter for general adult medical examination without abnormal findings: Secondary | ICD-10-CM

## 2019-10-17 DIAGNOSIS — E785 Hyperlipidemia, unspecified: Secondary | ICD-10-CM

## 2019-10-17 DIAGNOSIS — Z1211 Encounter for screening for malignant neoplasm of colon: Secondary | ICD-10-CM

## 2019-10-17 LAB — POCT GLYCOSYLATED HEMOGLOBIN (HGB A1C): HbA1c, POC (controlled diabetic range): 6.3 % (ref 0.0–7.0)

## 2019-10-17 MED ORDER — METFORMIN HCL 500 MG PO TABS: 500.0000 mg | ORAL_TABLET | Freq: Every day | ORAL | 3 refills | Status: DC

## 2019-10-17 MED ORDER — RELION PRIME MONITOR DEVI: 1.0000 | Freq: Every day | 0 refills | Status: DC

## 2019-10-17 MED ORDER — RELION LANCING DEVICE KIT: 1.0000 | PACK | Freq: Every day | 0 refills | Status: DC

## 2019-10-17 MED ORDER — RELION PRIME TEST VI STRP: ORAL_STRIP | 12 refills | Status: DC

## 2019-10-17 MED ORDER — RELION LANCETS THIN 26G MISC: 1.0000 | Freq: Every day | 2 refills | Status: AC

## 2019-10-17 NOTE — Patient Instructions (Signed)
  Diet Recommendations for Diabetes   Starchy (carb) foods: Bread, rice, pasta, potatoes, corn, cereal, grits, crackers, bagels, muffins, all baked goods.  (Fruits, milk, and yogurt also have carbohydrate, but most of these foods will not spike your blood sugar as the starchy foods will.)  A few fruits do cause high blood sugars; use small portions of bananas (limit to 1/2 at a time), grapes, watermelon, oranges, and most tropical fruits.    Protein foods: Meat, fish, poultry, eggs, dairy foods, and beans such as pinto and kidney beans (beans also provide carbohydrate).   1. Eat at least 3 meals and 1-2 snacks per day. Never go more than 4-5 hours while awake without eating. Eat breakfast within the first hour of getting up.   2. Limit starchy foods to TWO per meal and ONE per snack. ONE portion of a starchy  food is equal to the following:   - ONE slice of bread (or its equivalent, such as half of a hamburger bun).   - 1/2 cup of a "scoopable" starchy food such as potatoes or rice.   - 15 grams of carbohydrate as shown on food label.  3. Include at every meal: a protein food, a carb food, and vegetables and/or fruit.   - Obtain twice the volume of veg's as protein or carbohydrate foods for both lunch and dinner.   - Fresh or frozen veg's are best.   - Keep frozen veg's on hand for a quick vegetable serving.      

## 2019-10-17 NOTE — Assessment & Plan Note (Addendum)
Controlled with A1c of 6.3.  Patient has only been taking Metformin once a day.  I advised her to continue this unchanged prescription in EMR.  Patient to continue healthy diet and daily exercise.  I congratulated patient on maintaining a low A1c.  Foot exam updated today.  Patient will obtain eye exam within this year and referral has been placed.  Will obtain baseline labs including lipid panel, CBC, BMP.  Up-to-date on pneumonia vaccine.  Will obtain urine microalbumin creatinine ratio today as well.  Rx for glucometer sent to patient so that she may have this at home in case of hypoglycemic episodes however unlikely given use of Metformin.

## 2019-10-17 NOTE — Assessment & Plan Note (Signed)
Referral to GI placed for colonoscopy.  Patient diet information in AVS as well for phone numbers for GI so she may call to schedule appointment.  Flu vaccine obtained at [facility] on October 16 so will update EMR.  Baseline labs obtained today including CBC, BMP, lipid panel.

## 2019-10-17 NOTE — Assessment & Plan Note (Signed)
Lipid panel obtained today.  Continue atorvastatin.  Continue healthy diet and daily exercise

## 2019-10-18 ENCOUNTER — Telehealth: Payer: Self-pay | Admitting: *Deleted

## 2019-10-18 ENCOUNTER — Encounter: Payer: Self-pay | Admitting: Gastroenterology

## 2019-10-18 LAB — CBC
Hematocrit: 43.6 % (ref 34.0–46.6)
Hemoglobin: 14.6 g/dL (ref 11.1–15.9)
MCH: 29.4 pg (ref 26.6–33.0)
MCHC: 33.5 g/dL (ref 31.5–35.7)
MCV: 88 fL (ref 79–97)
Platelets: 329 10*3/uL (ref 150–450)
RBC: 4.97 x10E6/uL (ref 3.77–5.28)
RDW: 13.4 % (ref 11.7–15.4)
WBC: 5.5 10*3/uL (ref 3.4–10.8)

## 2019-10-18 LAB — BASIC METABOLIC PANEL
BUN/Creatinine Ratio: 16 (ref 9–23)
BUN: 10 mg/dL (ref 6–24)
CO2: 23 mmol/L (ref 20–29)
Calcium: 9.6 mg/dL (ref 8.7–10.2)
Chloride: 105 mmol/L (ref 96–106)
Creatinine, Ser: 0.64 mg/dL (ref 0.57–1.00)
GFR calc Af Amer: 119 mL/min/{1.73_m2} (ref >59)
GFR calc non Af Amer: 103 mL/min/{1.73_m2} (ref >59)
Glucose: 107 mg/dL — ABNORMAL HIGH (ref 65–99)
Potassium: 4.3 mmol/L (ref 3.5–5.2)
Sodium: 142 mmol/L (ref 134–144)

## 2019-10-18 LAB — LIPID PANEL
Chol/HDL Ratio: 3.1 ratio (ref 0.0–4.4)
Cholesterol, Total: 187 mg/dL (ref 100–199)
HDL: 61 mg/dL (ref >39)
LDL Chol Calc (NIH): 106 mg/dL — ABNORMAL HIGH (ref 0–99)
Triglycerides: 113 mg/dL (ref 0–149)
VLDL Cholesterol Cal: 20 mg/dL (ref 5–40)

## 2019-10-18 NOTE — Telephone Encounter (Signed)
-----   Message from Oralia Manis, DO sent at 10/18/2019  2:46 PM EST ----- Please inform patient that results of labs are negative. The 10-year ASCVD risk score Denman George DC Jr., et al., 2013) is: 2.4%. Advised healthy diet and daily exercise for elevated LDL.

## 2019-10-18 NOTE — Telephone Encounter (Signed)
Pt informed. Evamaria Detore, CMA  

## 2019-10-19 DIAGNOSIS — E785 Hyperlipidemia, unspecified: Secondary | ICD-10-CM | POA: Insufficient documentation

## 2019-10-19 LAB — MICROALBUMIN / CREATININE URINE RATIO
Creatinine, Urine: 8.4 mg/dL
Microalbumin, Urine: <3 ug/mL

## 2019-10-20 NOTE — Telephone Encounter (Signed)
Pt informed. Theresa Diaz, CMA  

## 2019-10-20 NOTE — Telephone Encounter (Signed)
-----   Message from Oralia Manis, DO sent at 10/20/2019 11:40 AM EST ----- Please inform patient that results of urine microalbumin:creatine that we have are negative. However, she will need to repeat sample next time she comes in given low amount of urine in sample.

## 2019-10-30 ENCOUNTER — Other Ambulatory Visit: Payer: Self-pay

## 2019-10-30 ENCOUNTER — Ambulatory Visit (AMBULATORY_SURGERY_CENTER): Payer: Self-pay | Admitting: *Deleted

## 2019-10-30 VITALS — Temp 96.8°F | Ht 60.0 in | Wt 131.0 lb

## 2019-10-30 DIAGNOSIS — Z01818 Encounter for other preprocedural examination: Secondary | ICD-10-CM

## 2019-10-30 DIAGNOSIS — Z1211 Encounter for screening for malignant neoplasm of colon: Secondary | ICD-10-CM

## 2019-10-30 NOTE — Progress Notes (Signed)
Patient is here in-person for PV. Interpreter with pt temp=98.0. Patient denies any allergies to eggs or soy. Patient denies any problems with anesthesia/sedation. Patient denies any oxygen use at home. Patient denies taking any diet/weight loss medications or blood thinners. Patient is not being treated for MRSA or C-diff. EMMI education assisgned to the patient for the procedure, this was explained and instructions given to patient. COVID-19 screening test is on 4/1, the pt is aware.  Patient is aware of our care-partner policy and Covid-19 safety protocol. Pt has suprep at home.

## 2019-11-09 ENCOUNTER — Other Ambulatory Visit: Payer: Self-pay | Admitting: Gastroenterology

## 2019-11-09 ENCOUNTER — Ambulatory Visit (INDEPENDENT_AMBULATORY_CARE_PROVIDER_SITE_OTHER): Payer: 59

## 2019-11-09 DIAGNOSIS — Z1159 Encounter for screening for other viral diseases: Secondary | ICD-10-CM

## 2019-11-09 LAB — SARS CORONAVIRUS 2 (TAT 6-24 HRS): SARS Coronavirus 2: NEGATIVE

## 2019-11-13 ENCOUNTER — Encounter: Payer: Self-pay | Admitting: Gastroenterology

## 2019-11-13 ENCOUNTER — Other Ambulatory Visit: Payer: Self-pay

## 2019-11-13 ENCOUNTER — Ambulatory Visit (AMBULATORY_SURGERY_CENTER): Payer: 59 | Admitting: Gastroenterology

## 2019-11-13 VITALS — BP 113/76 | HR 59 | Temp 96.9°F | Resp 12 | Ht 60.0 in | Wt 131.0 lb

## 2019-11-13 DIAGNOSIS — Z1211 Encounter for screening for malignant neoplasm of colon: Secondary | ICD-10-CM | POA: Diagnosis present

## 2019-11-13 MED ORDER — SODIUM CHLORIDE 0.9 % IV SOLN: 500.0000 mL | Freq: Once | INTRAVENOUS | Status: DC

## 2019-11-13 NOTE — Progress Notes (Signed)
DT- vitals JB- temp 

## 2019-11-13 NOTE — Progress Notes (Signed)
A and O x3. Report to RN. Tolerated MAC anesthesia well.

## 2019-11-13 NOTE — Op Note (Addendum)
[facility] Patient Name: Theresa Diaz Procedure Date: 11/13/2019 8:57 AM MRN: [id number] Endoscopist: Napoleon Form , MD Age: 54 Referring MD:  Date of Birth: 1965/09/24 Gender: Female Account #: [id number] Procedure:                Colonoscopy Indications:              Screening for colorectal malignant neoplasm Medicines:                Monitored Anesthesia Care Procedure:                Pre-Anesthesia Assessment:                           - Prior to the procedure, a History and Physical                            was performed, and patient medications and                            allergies were reviewed. The patient's tolerance of                            previous anesthesia was also reviewed. The risks                            and benefits of the procedure and the sedation                            options and risks were discussed with the patient.                            All questions were answered, and informed consent                            was obtained. Prior Anticoagulants: The patient has                            taken no previous anticoagulant or antiplatelet                            agents. ASA Grade Assessment: II - A patient with                            mild systemic disease. After reviewing the risks                            and benefits, the patient was deemed in                            satisfactory condition to undergo the procedure.                           After obtaining informed consent, the colonoscope  was passed under direct vision. Throughout the                            procedure, the patient's blood pressure, pulse, and                            oxygen saturations were monitored continuously. The                            Colonoscope was introduced through the anus and                            advanced to the the cecum, identified by                            appendiceal orifice and  ileocecal valve. The                            colonoscopy was performed without difficulty. The                            patient tolerated the procedure well. The quality                            of the bowel preparation was excellent. The                            ileocecal valve, appendiceal orifice, and rectum                            were photographed. Scope In: 9:07:15 AM Scope Out: 9:18:02 AM Scope Withdrawal Time: 0 hours 8 minutes 2 seconds  Total Procedure Duration: 0 hours 10 minutes 47 seconds  Findings:                 The perianal and digital rectal examinations were                            normal.                           Non-bleeding external and internal hemorrhoids were                            found during retroflexion. The hemorrhoids were                            large.                           The exam was otherwise without abnormality. Complications:            No immediate complications. Estimated Blood Loss:     Estimated blood loss: none. Impression:               - Non-bleeding external and internal hemorrhoids.                           -  The examination was otherwise normal.                           - No specimens collected. Recommendation:           - Patient has a contact number available for                            emergencies. The signs and symptoms of potential                            delayed complications were discussed with the                            patient. Return to normal activities tomorrow.                            Written discharge instructions were provided to the                            patient.                           - Resume previous diet.                           - Continue present medications.                           - Repeat colonoscopy in 10 years for screening                            purposes. Napoleon Form, MD 11/13/2019 9:24:01 AM This report has been signed electronically.

## 2019-11-13 NOTE — Progress Notes (Signed)
Pt's states no medical or surgical changes since previsit or office visit. 

## 2019-11-13 NOTE — Patient Instructions (Signed)
YOU HAD AN ENDOSCOPIC PROCEDURE TODAY AT THE [facility]:   Refer to the procedure report that was given to you for any specific questions about what was found during the examination.  If the procedure report does not answer your questions, please call your gastroenterologist to clarify.  If you requested that your care partner not be given the details of your procedure findings, then the procedure report has been included in a sealed envelope for you to review at your convenience later.  YOU SHOULD EXPECT: Some feelings of bloating in the abdomen. Passage of more gas than usual.  Walking can help get rid of the air that was put into your GI tract during the procedure and reduce the bloating. If you had a lower endoscopy (such as a colonoscopy or flexible sigmoidoscopy) you may notice spotting of blood in your stool or on the toilet paper. If you underwent a bowel prep for your procedure, you may not have a normal bowel movement for a few days.  Please Note:  You might notice some irritation and congestion in your nose or some drainage.  This is from the oxygen used during your procedure.  There is no need for concern and it should clear up in a day or so.  SYMPTOMS TO REPORT IMMEDIATELY:   Following lower endoscopy (colonoscopy or flexible sigmoidoscopy):  Excessive amounts of blood in the stool  Significant tenderness or worsening of abdominal pains  Swelling of the abdomen that is new, acute  Fever of 100F or higher  For urgent or emergent issues, a gastroenterologist can be reached at any hour by calling [phone]. Do not use MyChart messaging for urgent concerns.    DIET:  We do recommend a small meal at first, but then you may proceed to your regular diet.  Drink plenty of fluids but you should avoid alcoholic beverages for 24 hours.  ACTIVITY:  You should plan to take it easy for the rest of today and you should NOT DRIVE or use heavy machinery until tomorrow (because  of the sedation medicines used during the test).    FOLLOW UP: Our staff will call the number listed on your records 48-72 hours following your procedure to check on you and address any questions or concerns that you may have regarding the information given to you following your procedure. If we do not reach you, we will leave a message.  We will attempt to reach you two times.  During this call, we will ask if you have developed any symptoms of COVID 19. If you develop any symptoms (ie: fever, flu-like symptoms, shortness of breath, cough etc.) before then, please call [phone].  If you test positive for Covid 19 in the 2 weeks post procedure, please call and report this information to us.    If any biopsies were taken you will be contacted by phone or by letter within the next 1-3 weeks.  Please call us at [phone] if you have not heard about the biopsies in 3 weeks.    SIGNATURES/CONFIDENTIALITY: You and/or your care partner have signed paperwork which will be entered into your electronic medical record.  These signatures attest to the fact that that the information above on your After Visit Summary has been reviewed and is understood.  Full responsibility of the confidentiality of this discharge information lies with you and/or your care-partner. 

## 2019-11-15 ENCOUNTER — Telehealth: Payer: Self-pay

## 2019-11-15 NOTE — Telephone Encounter (Signed)
  Follow up Call-  Call back number 11/13/2019  Post procedure Call Back phone  # [phone] Theresa Diaz (undersands english)  Permission to leave phone message Yes  Some recent data might be hidden     Patient questions:  Do you have a fever, pain , or abdominal swelling? No. Pain Score  0 *  Have you tolerated food without any problems? Yes.    Have you been able to return to your normal activities? Yes.    Do you have any questions about your discharge instructions: Diet   No. Medications  No. Follow up visit  No.  Do you have questions or concerns about your Care? No.  Actions: * If pain score is 4 or above: No action needed, pain <4.  Spoke with Theresa Diaz, who speaks english.  1. Have you developed a fever since your procedure? no  2.   Have you had an respiratory symptoms (SOB or cough) since your procedure? no  3.   Have you tested positive for COVID 19 since your procedure no  4.   Have you had any family members/close contacts diagnosed with the COVID 19 since your procedure?  no   If yes to any of these questions please route to Laverna Peace, RN and Charlett Lango, RN

## 2020-05-14 ENCOUNTER — Ambulatory Visit
Admission: RE | Admit: 2020-05-14 | Discharge: 2020-05-14 | Disposition: A | Discharge status: Home / Self Care | Payer: 59 | Source: Ambulatory Visit | Attending: Family Medicine | Admitting: Family Medicine

## 2020-05-14 ENCOUNTER — Other Ambulatory Visit: Payer: Self-pay

## 2020-05-14 DIAGNOSIS — Z1231 Encounter for screening mammogram for malignant neoplasm of breast: Secondary | ICD-10-CM

## 2020-07-23 ENCOUNTER — Ambulatory Visit: Payer: 59

## 2020-11-11 ENCOUNTER — Encounter: Payer: Self-pay | Admitting: Family Medicine

## 2020-11-11 ENCOUNTER — Ambulatory Visit: Payer: 59 | Admitting: Family Medicine

## 2020-11-11 ENCOUNTER — Other Ambulatory Visit: Payer: Self-pay

## 2020-11-11 VITALS — BP 110/70 | HR 73 | Ht 60.0 in | Wt 132.6 lb

## 2020-11-11 DIAGNOSIS — E1169 Type 2 diabetes mellitus with other specified complication: Secondary | ICD-10-CM | POA: Diagnosis not present

## 2020-11-11 DIAGNOSIS — E785 Hyperlipidemia, unspecified: Secondary | ICD-10-CM | POA: Diagnosis not present

## 2020-11-11 DIAGNOSIS — E119 Type 2 diabetes mellitus without complications: Secondary | ICD-10-CM

## 2020-11-11 DIAGNOSIS — R87618 Other abnormal cytological findings on specimens from cervix uteri: Secondary | ICD-10-CM | POA: Diagnosis not present

## 2020-11-11 LAB — POCT GLYCOSYLATED HEMOGLOBIN (HGB A1C): HbA1c, POC (controlled diabetic range): 6.3 % (ref 0.0–7.0)

## 2020-11-11 MED ORDER — ATORVASTATIN CALCIUM 10 MG PO TABS: 1.0000 | ORAL_TABLET | Freq: Every day | ORAL | 3 refills | Status: DC

## 2020-11-11 MED ORDER — METFORMIN HCL 500 MG PO TABS: 500.0000 mg | ORAL_TABLET | Freq: Every day | ORAL | 3 refills | Status: DC

## 2020-11-11 NOTE — Progress Notes (Signed)
    SUBJECTIVE:   CHIEF COMPLAINT / HPI: Diabetes follow-up  Theresa Diaz is a 55 year old female presenting for diabetic follow-up.  She has no other concerns today.  Diabetes: A1c consistently around 6.3-6.4, has not been seen since 10/2019.  Takes Metformin 500 mg daily.  Has not had a diabetic eye exam before.  Tries to follow a balanced diet with plenty of vegetables and brown rice.  She works at a [employer] on a frequent basis, on her feet often.  Does not have a formal exercise regimen but stays busy.  Hyperlipidemia: She has been out of her Lipitor, she was told by her pharmacy she could not get a refill.  Tolerated Lipitor well and would like to restart.   PERTINENT  PMH / PSH: Hyperlipidemia, well controlled T2DM, previous abnormal Pap smear  OBJECTIVE:   BP 110/70   Pulse 73   Ht 5' (1.524 m)   Wt 132 lb 9.6 oz (60.1 kg)   LMP 05/28/2016   SpO2 98%   BMI 25.90 kg/m   General: Alert, NAD HEENT: NCAT, MMM Cardiac: RRR no m/g/r Lungs: Clear bilaterally, no increased WOB  Msk: Moves all extremities spontaneously  Ext: Warm, dry  Diabetic Foot Exam - Simple   Simple Foot Form Diabetic Foot exam was performed with the following findings: Yes 11/11/2020  4:57 PM  Visual Inspection No deformities, no ulcerations, no other skin breakdown bilaterally: Yes Sensation Testing Intact to touch and monofilament testing bilaterally: Yes Pulse Check Posterior Tibialis and Dorsalis pulse intact bilaterally: Yes Comments     ASSESSMENT/PLAN:   Diabetes (HCC) A1c 6.3 today, well controlled and has maintained this for several years.  Continue metformin 500 mg daily as is.  Encouraged maintaining physical activity and balanced diet.  Placed referral to ophthalmology for diabetic eye exam.  Check BMP on next visit, patient would like to wait for all labs at that time.  Unable to provide urine sample today (went prior to appointment), obtain microalbumin/creatinine ratio on  follow-up.  Hyperlipidemia associated with type 2 diabetes mellitus (HCC) Restart Lipitor for primary prevention.  Plan on rechecking lipid panel in 3 months.  Previously LDL 101 in 2020.  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  Her OB/GYN did recommend repeat colpo after initial, however this was never completed. Through ASCCP guidelines, may be able to observe through 05/2022 with HPV based cytology at that time.  Called patient to inform, she would like to follow-up with her OB/GYN and provided their contact information for her to call tomorrow.    Follow-up in 3 months or sooner if needed, obtain a lipid panel, BMP, and urine microalbumin at that time.  Allayne Stack, DO [facility]

## 2020-11-11 NOTE — Assessment & Plan Note (Addendum)
A1c 6.3 today, well controlled and has maintained this for several years.  Continue metformin 500 mg daily as is.  Encouraged maintaining physical activity and balanced diet.  Placed referral to ophthalmology for diabetic eye exam.  Check BMP on next visit, patient would like to wait for all labs at that time.  Unable to provide urine sample today (went prior to appointment), obtain microalbumin/creatinine ratio on follow-up.

## 2020-11-11 NOTE — Patient Instructions (Signed)
Great to see you today!   Your diabetes looks great today.  Keep up the good work.  We will check your cholesterol, kidney function, and urine on your next visit in the next 3 months.  I will also place referral to an eye doctor hopefully that accept your insurance to have a diabetic eye exam.  Please come back in 3 months or sooner if needed.

## 2020-11-11 NOTE — Assessment & Plan Note (Addendum)
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  Her OB/GYN did recommend repeat colpo after initial, however this was never completed. Through ASCCP guidelines, may be able to observe through 05/2022 with HPV based cytology at that time.  Called patient to inform, she would like to follow-up with her OB/GYN and provided their contact information for her to call tomorrow.

## 2020-11-11 NOTE — Assessment & Plan Note (Signed)
Restart Lipitor for primary prevention.  Plan on rechecking lipid panel in 3 months.  Previously LDL 101 in 2020.

## 2020-12-03 ENCOUNTER — Other Ambulatory Visit: Payer: 59

## 2020-12-03 ENCOUNTER — Other Ambulatory Visit: Payer: Self-pay

## 2020-12-03 ENCOUNTER — Other Ambulatory Visit: Payer: Self-pay | Admitting: Family Medicine

## 2020-12-03 DIAGNOSIS — E119 Type 2 diabetes mellitus without complications: Secondary | ICD-10-CM

## 2020-12-04 LAB — LIPID PANEL
Chol/HDL Ratio: 3.2 ratio (ref 0.0–4.4)
Cholesterol, Total: 176 mg/dL (ref 100–199)
HDL: 55 mg/dL (ref >39)
LDL Chol Calc (NIH): 104 mg/dL — ABNORMAL HIGH (ref 0–99)
Triglycerides: 91 mg/dL (ref 0–149)
VLDL Cholesterol Cal: 17 mg/dL (ref 5–40)

## 2020-12-04 LAB — BASIC METABOLIC PANEL
BUN/Creatinine Ratio: 20 (ref 9–23)
BUN: 12 mg/dL (ref 6–24)
CO2: 23 mmol/L (ref 20–29)
Calcium: 9.4 mg/dL (ref 8.7–10.2)
Chloride: 103 mmol/L (ref 96–106)
Creatinine, Ser: 0.61 mg/dL (ref 0.57–1.00)
Glucose: 109 mg/dL — ABNORMAL HIGH (ref 65–99)
Potassium: 4.3 mmol/L (ref 3.5–5.2)
Sodium: 142 mmol/L (ref 134–144)
eGFR: 107 mL/min/{1.73_m2} (ref >59)

## 2020-12-04 LAB — MICROALBUMIN / CREATININE URINE RATIO
Creatinine, Urine: 11.1 mg/dL
Microalb/Creat Ratio: <27 mg/g creat (ref 0–29)
Microalbumin, Urine: <3 ug/mL

## 2020-12-05 ENCOUNTER — Encounter: Payer: Self-pay | Admitting: Family Medicine

## 2021-03-03 NOTE — Progress Notes (Signed)
    SUBJECTIVE:   CHIEF COMPLAINT / HPI: DM   DM: Patient has history of controlled DM, last A1c in April 2022 6.3%. Current regimen includes metformin 500 mg qd. She denies symptoms of nausea, shakiness. Will recheck A1c today  Dizziness:  patient reports that several times in the last 2 weeks she has had dizziness momentarily after standing up.  The dizziness lasts only a few seconds.  She has noted she has had to hold onto things feel more stable when standing up, and she is slowed down when she stands up.  She does not have "room spinning" type of dizziness, and it does not occur with changing the position of her head.  She does mention that she works in her garden for recreation and has been outside in the heat for many days in the last 2 weeks.  She has never had these symptoms before.  PERTINENT  PMH / PSH: DM 2  OBJECTIVE:   BP 112/66   Pulse 72   Ht 5' (1.524 m)   Wt 130 lb (59 kg)   LMP 05/28/2016   SpO2 98%   BMI 25.39 kg/m   Nursing note and vitals reviewed GEN: Age-appropriate, [place] Asian woman, resting comfortably in chair, NAD, WNWD, alert and at baseline HEENT: NCAT. PERRLA. Sclera without injection or icterus. MMM.  Cardiac: Regular rate and rhythm. Normal S1/S2. No murmurs, rubs, or gallops appreciated. 2+ radial pulses. Lungs: Clear bilaterally to ascultation. No increased WOB, no accessory muscle usage. No w/r/r. Ext: no edema Psych: Pleasant and appropriate   Orthostatic Vital Signs Lying: 121/77 74 BPM 99% Sitting: 121/86 69 bpm 97% Standing: 131/80 71 bpm 97% ASSESSMENT/PLAN:   Diabetes (HCC) Well controlled. A1c 6.3% today, no change to regimen. Next A1c check in 6 months.  Dizziness Associated with positional changes.  Patient has been working outside in the heat lately as well.  Due to her good blood glucose control with A1c 6.3% less likely to be diabetes, considering patient is only on metformin 500 mg.  Recommend she hydrate well and eat more  carbohydrate-based snack before working in her garden, avoiding hottest times of the day.  Also recommend positional changes slowly.  Orthostatic vitals were negative, however I suspect combination of heat, dehydration, diabetes and positional changes led to the symptoms.  Can consider belly band or back brace if patient has persistent symptoms.  If she tries this and is still bothered by symptoms, recommend return for further evaluation.     Shirlean Mylar, MD [facility]

## 2021-03-04 ENCOUNTER — Ambulatory Visit: Payer: 59 | Admitting: Family Medicine

## 2021-03-04 ENCOUNTER — Other Ambulatory Visit: Payer: Self-pay

## 2021-03-04 VITALS — BP 112/66 | HR 72 | Ht 60.0 in | Wt 130.0 lb

## 2021-03-04 DIAGNOSIS — E1169 Type 2 diabetes mellitus with other specified complication: Secondary | ICD-10-CM | POA: Diagnosis not present

## 2021-03-04 DIAGNOSIS — E785 Hyperlipidemia, unspecified: Secondary | ICD-10-CM

## 2021-03-04 DIAGNOSIS — R42 Dizziness and giddiness: Secondary | ICD-10-CM | POA: Diagnosis not present

## 2021-03-04 DIAGNOSIS — E119 Type 2 diabetes mellitus without complications: Secondary | ICD-10-CM

## 2021-03-04 LAB — POCT GLYCOSYLATED HEMOGLOBIN (HGB A1C): HbA1c, POC (controlled diabetic range): 6.3 % (ref 0.0–7.0)

## 2021-03-04 NOTE — Assessment & Plan Note (Signed)
Associated with positional changes.  Patient has been working outside in the heat lately as well.  Due to her good blood glucose control with A1c 6.3% less likely to be diabetes, considering patient is only on metformin 500 mg.  Recommend she hydrate well and eat more carbohydrate-based snack before working in her garden, avoiding hottest times of the day.  Also recommend positional changes slowly.  Orthostatic vitals were negative, however I suspect combination of heat, dehydration, diabetes and positional changes led to the symptoms.  Can consider belly band or back brace if patient has persistent symptoms.  If she tries this and is still bothered by symptoms, recommend return for further evaluation.

## 2021-03-04 NOTE — Patient Instructions (Addendum)
It was a pleasure to see you today!  1.  Your diabetes is very well controlled.  Keep up the great work!  Your hemoglobin A1c today was 6.3%  2.  For your dizziness, this is most likely due to to a slow physiologic response to increase your blood pressure as you stand up.  Especially with it being so hot outside, I recommend that you stay well-hydrated, drink 1 to 1-1/2 L of water a day.  Also take your time when standing up, and if it continues to be a problem you can consider using a belly binder which can help.  Belly binder or a back brace can be found at any pharmacy, [facility], [employer] etc.  I have put a picture below to help.  If you have continued problems even with this, return for further evaluation.  Be Well,  Dr. Leary Roca

## 2021-03-04 NOTE — Assessment & Plan Note (Signed)
Well controlled. A1c 6.3% today, no change to regimen. Next A1c check in 6 months.

## 2021-04-09 ENCOUNTER — Encounter: Payer: Self-pay | Admitting: Family Medicine

## 2021-04-09 ENCOUNTER — Other Ambulatory Visit: Payer: Self-pay | Admitting: Family Medicine

## 2021-04-09 ENCOUNTER — Ambulatory Visit (INDEPENDENT_AMBULATORY_CARE_PROVIDER_SITE_OTHER): Payer: 59 | Admitting: Family Medicine

## 2021-04-09 ENCOUNTER — Other Ambulatory Visit: Payer: Self-pay

## 2021-04-09 VITALS — BP 110/70 | HR 73 | Ht 61.0 in | Wt 132.0 lb

## 2021-04-09 DIAGNOSIS — K649 Unspecified hemorrhoids: Secondary | ICD-10-CM | POA: Diagnosis not present

## 2021-04-09 DIAGNOSIS — Z1231 Encounter for screening mammogram for malignant neoplasm of breast: Secondary | ICD-10-CM

## 2021-04-09 MED ORDER — ROSUVASTATIN CALCIUM 10 MG PO TABS: 10.0000 mg | ORAL_TABLET | Freq: Every day | ORAL | 3 refills | Status: DC

## 2021-04-09 NOTE — Patient Instructions (Signed)
It was great seeing you today!  Today you came in for hemorrhoids, and while there is not a pill that you can take to reduce it, you can get an over-the-counter cream to help with the pain called Preparation H.  You can also get MiraLAX over-the-counter to help loosen the bowel movements to make the pain more tolerable as well.  You would use a capful in about 8 ounces of juice.  Drinking plenty of water, eating vegetables and fruit will be helpful for your bowel movement as well.  I have prescribed a different medication for your cholesterol that will hopefully be cheaper.  You can also get your Shingrix vaccine from the pharmacy. I have included information about that below.   If you start experiencing blood in the stool, worsening pain, or any new and concerning symptoms please give [place] a call at the clinic  Feel free to call with any questions or concerns at any time, at [phone].   Take care,  Dr. Cora Collum [facility]   Hemorrhoids Hemorrhoids are swollen veins in and around the rectum or anus. There are two types of hemorrhoids: Internal hemorrhoids. These occur in the veins that are just inside the rectum. They may poke through to the outside and become irritated and painful. External hemorrhoids. These occur in the veins that are outside the anus and can be felt as a painful swelling or hard lump near the anus. Most hemorrhoids do not cause serious problems, and they can be managed with home treatments such as diet and lifestyle changes. If home treatments do not help the symptoms, procedures can be done to shrink or remove the hemorrhoids. What are the causes? This condition is caused by increased pressure in the anal area. This pressure may result from various things, including: Constipation. Straining to have a bowel movement. Diarrhea. Pregnancy. Obesity. Sitting for long periods of time. Heavy lifting or other activity that causes you to  strain. Anal sex. Riding a bike for a long period of time. What are the signs or symptoms? Symptoms of this condition include: Pain. Anal itching or irritation. Rectal bleeding. Leakage of stool (feces). Anal swelling. One or more lumps around the anus. How is this diagnosed? This condition can often be diagnosed through a visual exam. Other exams or tests may also be done, such as: An exam that involves feeling the rectal area with a gloved hand (digital rectal exam). An exam of the anal canal that is done using a small tube (anoscope). A blood test, if you have lost a significant amount of blood. A test to look inside the colon using a flexible tube with a camera on the end (sigmoidoscopy or colonoscopy). How is this treated? This condition can usually be treated at home. However, various procedures may be done if dietary changes, lifestyle changes, and other home treatments do not help your symptoms. These procedures can help make the hemorrhoids smaller or remove them completely. Some of these procedures involve surgery, and others do not. Common procedures include: Rubber band ligation. Rubber bands are placed at the base of the hemorrhoids to cut off their blood supply. Sclerotherapy. Medicine is injected into the hemorrhoids to shrink them. Infrared coagulation. A type of light energy is used to get rid of the hemorrhoids. Hemorrhoidectomy surgery. The hemorrhoids are surgically removed, and the veins that supply them are tied off. Stapled hemorrhoidopexy surgery. The surgeon staples the base of the hemorrhoid to the rectal wall. Follow  these instructions at home: Eating and drinking  Eat foods that have a lot of fiber in them, such as whole grains, beans, nuts, fruits, and vegetables. Ask your health care provider about taking products that have added fiber (fiber supplements). Reduce the amount of fat in your diet. You can do this by eating low-fat dairy products, eating less  red meat, and avoiding processed foods. Drink enough fluid to keep your urine pale yellow. Managing pain and swelling  Take warm sitz baths for 20 minutes, 3-4 times a day to ease pain and discomfort. You may do this in a bathtub or using a portable sitz bath that fits over the toilet. If directed, apply ice to the affected area. Using ice packs between sitz baths may be helpful. Put ice in a plastic bag. Place a towel between your skin and the bag. Leave the ice on for 20 minutes, 2-3 times a day. General instructions Take over-the-counter and prescription medicines only as told by your health care provider. Use medicated creams or suppositories as told. Get regular exercise. Ask your health care provider how much and what kind of exercise is best for you. In general, you should do moderate exercise for at least 30 minutes on most days of the week (150 minutes each week). This can include activities such as walking, biking, or yoga. Go to the bathroom when you have the urge to have a bowel movement. Do not wait. Avoid straining to have bowel movements. Keep the anal area dry and clean. Use wet toilet paper or moist towelettes after a bowel movement. Do not sit on the toilet for long periods of time. This increases blood pooling and pain. Keep all follow-up visits as told by your health care provider. This is important. Contact a health care provider if you have: Increasing pain and swelling that are not controlled by treatment or medicine. Difficulty having a bowel movement, or you are unable to have a bowel movement. Pain or inflammation outside the area of the hemorrhoids. Get help right away if you have: Uncontrolled bleeding from your rectum. Summary Hemorrhoids are swollen veins in and around the rectum or anus. Most hemorrhoids can be managed with home treatments such as diet and lifestyle changes. Taking warm sitz baths can help ease pain and discomfort. In severe cases,  procedures or surgery can be done to shrink or remove the hemorrhoids. This information is not intended to replace advice given to you by your health care provider. Make sure you discuss any questions you have with your health care provider. Document Revised: 12/23/2018 Document Reviewed: 12/16/2017 Elsevier Patient Education  2022 Elsevier Inc.   Zoster Vaccine, Recombinant injection What is this medication? ZOSTER VACCINE (ZOS ter vak SEEN) is a vaccine used to reduce the risk of getting shingles. This vaccine is not used to treat shingles or nerve pain from shingles. This medicine may be used for other purposes; ask your health care provider or pharmacist if you have questions. COMMON BRAND NAME(S): [facility] What should I tell my care team before I take this medication? They need to know if you have any of these conditions: cancer immune system problems an unusual or allergic reaction to Zoster vaccine, other medications, foods, dyes, or preservatives pregnant or trying to get pregnant breast-feeding How should I use this medication? This vaccine is injected into a muscle. It is given by a health care provider. A copy of Vaccine Information Statements will be given before each vaccination. Be sure to read  this information carefully each time. This sheet may change often. Talk to your health care provider about the use of this vaccine in children. This vaccine is not approved for use in children. Overdosage: If you think you have taken too much of this medicine contact a poison control center or emergency room at once. NOTE: This medicine is only for you. Do not share this medicine with others. What if I miss a dose? Keep appointments for follow-up (booster) doses. It is important not to miss your dose. Call your health care provider if you are unable to keep an appointment. What may interact with this medication? medicines that suppress your immune system medicines to treat  cancer steroid medicines like prednisone or cortisone This list may not describe all possible interactions. Give your health care provider a list of all the medicines, herbs, non-prescription drugs, or dietary supplements you use. Also tell them if you smoke, drink alcohol, or use illegal drugs. Some items may interact with your medicine. What should I watch for while using this medication? Visit your health care provider regularly. This vaccine, like all vaccines, may not fully protect everyone. What side effects may I notice from receiving this medication? Side effects that you should report to your doctor or health care professional as soon as possible: allergic reactions (skin rash, itching or hives; swelling of the face, lips, or tongue) trouble breathing Side effects that usually do not require medical attention (report these to your doctor or health care professional if they continue or are bothersome): chills headache fever nausea pain, redness, or irritation at site where injected tiredness vomiting This list may not describe all possible side effects. Call your doctor for medical advice about side effects. You may report side effects to FDA at 1-800-FDA-1088. Where should I keep my medication? This vaccine is only given by a health care provider. It will not be stored at home. NOTE: This sheet is a summary. It may not cover all possible information. If you have questions about this medicine, talk to your doctor, pharmacist, or health care provider.  2022 Elsevier/Gold Standard (2019-09-01 16:23:07)

## 2021-04-09 NOTE — Progress Notes (Signed)
    SUBJECTIVE:   CHIEF COMPLAINT / HPI:   Theresa Diaz is a 55 yo who presents for treatment for her hemorrhoids. She states she has had them for about 20 years since her last pregnancy. She believes she was given a pill previously to shrink the hemorrhoids, and is requesting a prescription for those pills today. She states the hemorrhoids are currently mildly painful. States if she eats something bad it is worsened. Denies blood in stool. States she does not want to have them removed. Denies wanting her hemorrhoids checked   Health maintenance:  Has received COVID booster Due for shingrix    PERTINENT  PMH / PSH:  Hemorrhoids, diabetes, HLD   OBJECTIVE:   BP 110/70   Pulse 73   Ht 5\' 1"  (1.549 m)   Wt 132 lb (59.9 kg)   LMP 05/28/2016   SpO2 99%   BMI 24.94 kg/m    General: alert, pleasant, NAD CV: RRR no murmurs Resp: CTAB normal WOB GI: soft, non distended  Derm: no visible rashes or lesions   ASSESSMENT/PLAN:   No problem-specific Assessment & Plan notes found for this encounter.   Hemorrhoids Patient presenting with chronic hemorrhoids for the past 20+ years that have been recently worsening. Advised patient to try  Otc preparation H and recommended eating foods with plenty of fiber. Recommended Miralax if needed, as straining/constipation worsens the hemorrhoids.   HLD For primary prevention. LDL 104. Patient states she did not start the Atorvastatin because it was too expensive. Prescribed Crestor 10mg  daily.   05/30/2016, DO [facility]

## 2021-05-20 ENCOUNTER — Ambulatory Visit
Admission: RE | Admit: 2021-05-20 | Discharge: 2021-05-20 | Disposition: A | Discharge status: Home / Self Care | Payer: PRIVATE HEALTH INSURANCE | Source: Ambulatory Visit | Attending: Family Medicine | Admitting: Family Medicine

## 2021-05-20 ENCOUNTER — Other Ambulatory Visit: Payer: Self-pay

## 2021-05-20 DIAGNOSIS — Z1231 Encounter for screening mammogram for malignant neoplasm of breast: Secondary | ICD-10-CM

## 2021-05-28 ENCOUNTER — Encounter: Payer: Self-pay | Admitting: Family Medicine

## 2021-10-20 ENCOUNTER — Encounter: Payer: Self-pay | Admitting: Student

## 2021-10-20 ENCOUNTER — Other Ambulatory Visit: Payer: Self-pay

## 2021-10-20 ENCOUNTER — Ambulatory Visit (INDEPENDENT_AMBULATORY_CARE_PROVIDER_SITE_OTHER): Payer: 59 | Admitting: Student

## 2021-10-20 VITALS — BP 118/62 | HR 77 | Wt 130.2 lb

## 2021-10-20 DIAGNOSIS — E1169 Type 2 diabetes mellitus with other specified complication: Secondary | ICD-10-CM | POA: Diagnosis not present

## 2021-10-20 DIAGNOSIS — E119 Type 2 diabetes mellitus without complications: Secondary | ICD-10-CM

## 2021-10-20 DIAGNOSIS — R87618 Other abnormal cytological findings on specimens from cervix uteri: Secondary | ICD-10-CM | POA: Diagnosis not present

## 2021-10-20 DIAGNOSIS — R42 Dizziness and giddiness: Secondary | ICD-10-CM

## 2021-10-20 DIAGNOSIS — E785 Hyperlipidemia, unspecified: Secondary | ICD-10-CM

## 2021-10-20 LAB — POCT GLYCOSYLATED HEMOGLOBIN (HGB A1C): HbA1c, POC (controlled diabetic range): 6.4 % (ref 0.0–7.0)

## 2021-10-20 MED ORDER — ROSUVASTATIN CALCIUM 10 MG PO TABS: 10.0000 mg | ORAL_TABLET | Freq: Every day | ORAL | 3 refills | Status: DC

## 2021-10-20 MED ORDER — METFORMIN HCL 500 MG PO TABS: 500.0000 mg | ORAL_TABLET | Freq: Every day | ORAL | 3 refills | Status: DC

## 2021-10-20 NOTE — Patient Instructions (Addendum)
It was great to see you! Thank you for allowing me to participate in your care! ? ?I recommend that you always bring your medications to each appointment as this makes it easy to ensure we are on the correct medications and helps [place] not miss when refills are needed. ? ?Our plans for today:  ?- Cholesterol check ?- Hgb A1c Check for diabetes ?- Human Papilloma Virus:  ?Your last PAP smear in 2019 showed you have a virus, HPV. This virus can cause cervical cancer of the uterus. You need to have a repeat PAP smear performed. Please schedule one with the clinic. ?- Dizziness: Please keep a log of your dizzy spells/episodes. Keep a journal detailing when they are happening,  ?how long the last, what you feel, and what you were doing before it started ?Please seek medical attention if: ?Dizziness happens more often,  ?You pass out and loose consciousness from dizziness, ?Dizziness is associated with nausea, vomiting,and headache ? ?We are checking some labs today, I will call you if they are abnormal will send you a MyChart message or a letter if they are normal.  If you do not hear about your labs in the next 2 weeks please let us know. ? ?Take care and seek immediate care sooner if you develop any concerns.  ? ?Dr. Bess Kinds, MD ?[facility] ? ?

## 2021-10-20 NOTE — Progress Notes (Unsigned)
°  SUBJECTIVE:   CHIEF COMPLAINT / HPI:    Check up and blood work  Concerns: Dizziness: Has had dizziness spells a couple of weeks ago. Has happen before but not often. 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 Not warm or sweaty, no fast heart rate. Felt like she was spinning, closed her eyes for a few seconds and that helped. First meal usually at 1 pm and then eats dinner in evening. Drinks a lot of water, 4-5 cups a day. Has happened 2-3 times before.   Diabetes: Hgb A1C today 6.4 Lab Results  Component Value Date   HGBA1C 6.3 03/04/2021  Medications: Metformin 500 mg daily Compliance: Symptoms: polyuria, polydyspia, nubmness/tingling in finger/toes   HLD: Lab Results  Component Value Date   CHOL 176 12/03/2020   HDL 55 12/03/2020   LDLCALC 104 (H) 12/03/2020   LDLDIRECT 137 (H) 10/08/2011   TRIG 91 12/03/2020   CHOLHDL 3.2 12/03/2020  Meds: Crestor 10 mg Compliance: Symptoms: Myalgias -Rec Lipid panel  HPV screen: Last Pap smear 05/04/18, HPV was detected, follow up?? Want's to schedule another visit for Pap Smear.   PERTINENT  PMH / PSH: ***  Past Medical History:  Diagnosis Date   Carpal tunnel syndrome 01/13/2016   Cholelithiasis 10/2014   Cubital tunnel syndrome on left 12/31/2016   Diabetes mellitus without complication (HCC)    Hyperlipidemia    Post-menopausal bleeding 05/04/2018    Past Surgical History:  Procedure Laterality Date   CESAREAN SECTION  1999   CHOLECYSTECTOMY N/A 11/01/2014   Procedure: LAPAROSCOPIC CHOLECYSTECTOMY WITH ATTEMPTED INTRAOPERATIVE CHOLANGIOGRAM;  Surgeon: Frederik Schmidt, MD;  Location: [facility] OR;  Service: General;  Laterality: N/A;   LAPAROSCOPY      OBJECTIVE:  LMP 05/28/2016   General: NAD, pleasant, able to participate in exam Cardiac: RRR, no murmurs auscultated. Respiratory: CTAB, normal effort, no wheezes, rales or rhonchi Abdomen: soft, non-tender, non-distended, normoactive  bowel sounds Extremities: warm and well perfused, no edema or cyanosis. Skin: warm and dry, no rashes noted Neuro: alert, no obvious focal deficits, speech normal Psych: Normal affect and mood  ASSESSMENT/PLAN:  No problem-specific Assessment & Plan notes found for this encounter.   No orders of the defined types were placed in this encounter.  No orders of the defined types were placed in this encounter.  No follow-ups on file. @SIGNNOTE @ {    This will disappear when note is signed, click to select method of visit    :1}

## 2021-10-21 LAB — LIPID PANEL
Chol/HDL Ratio: 3.3 ratio (ref 0.0–4.4)
Cholesterol, Total: 156 mg/dL (ref 100–199)
HDL: 48 mg/dL (ref >39)
LDL Chol Calc (NIH): 74 mg/dL (ref 0–99)
Triglycerides: 203 mg/dL — ABNORMAL HIGH (ref 0–149)
VLDL Cholesterol Cal: 34 mg/dL (ref 5–40)

## 2021-10-21 NOTE — Assessment & Plan Note (Signed)
Patient with recent history of dizzines, no syncope. She's had episodes in past, patient describes episodes as sudden and sporadic, not associated with position, hydration, or having not eaten. Episodes are short lived and self resolved after few seconds. Patient had no vasal vagal symptoms prior to episode. BP 112/60 during visit. Etiology of these episodes is unknown, will encourage patient to keep a log, with medical precautions.  ?-Start journal to document episodes: length, situation, symptoms ?

## 2021-10-21 NOTE — Assessment & Plan Note (Signed)
Patient last lipid panel was in 2022, with patient LDL, and total cholesterol elevated. Will recheck today. Consider increasing Crestor if lipid panel abnormal ?-Continue Crestor ?-Lipid panel ?

## 2021-10-21 NOTE — Assessment & Plan Note (Signed)
Patient A1c is slightly better, patient still in prediabetic range. Will continue metformin and continue to monitor. Patient was wondering about checking sugars, instructed patient that she does not need to, because of her prediabetic A1c ?-Continue Metformin 500 mg daily ?

## 2021-10-21 NOTE — Assessment & Plan Note (Signed)
Patient with history of HPV positive PAP in 2019, f/u in 2020 WNL. Patient due for Pap smear this year. Patient will schedule an appointment ?-Schedule Pap ?

## 2021-10-22 ENCOUNTER — Telehealth: Payer: Self-pay | Admitting: Student

## 2021-10-22 NOTE — Telephone Encounter (Signed)
Called and spoke with patient about PAP smear results from 2019 & 2020. Patient had PAP in 2019 that was positive for HPV, patient had follow up PAP in 2020 that was normal-reported in care everywhere. I told patient at visit that she had HPV, but later found the care everywhere notes showing most recent PAP. Spoke with patient and let her know that according to her last PAP, she does not have HPV. Informed patient she is due for another and she reported that it's already scheduled.  ?

## 2021-11-04 NOTE — Progress Notes (Signed)
? ? ?  SUBJECTIVE:  ? ?CHIEF COMPLAINT / HPI:  ? ?Pap Smear ?Theresa Diaz is a 56 y.o. female who presents to the [facility] clinic for Pap smear. 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 Was also HPV positive in 04/2016 and refused biopsy at the time. Today she has no complaints. She denies vaginal discharge or abnormal vaginal odor. Declines STI testing. Wondering about her previous history of HPV and how she may have gotten it. ? ?PERTINENT  PMH / PSH:  ?Past Medical History:  ?Diagnosis Date  ? Carpal tunnel syndrome 01/13/2016  ? Cholelithiasis 10/2014  ? Cubital tunnel syndrome on left 12/31/2016  ? Diabetes mellitus without complication ([place])   ? Hyperlipidemia   ? Post-menopausal bleeding 05/04/2018  ? ? ? ? ?OBJECTIVE:  ? ?BP 110/76   Pulse 83   Wt 128 lb 12.8 oz (58.4 kg)   LMP 05/28/2016   SpO2 99%   BMI 24.34 kg/m?   ? ?General: NAD, pleasant, able to participate in exam ?Respiratory: Breathing comfortably, no respiratory distress ?GU: Normal appearance of labia majora and minora, without lesions. Vagina tissue pink, moist, without lesions or abrasions. Cervix normal appearance, non-friable, without discharge from os.  ?Psych: Normal affect and mood ? ?GU Exam chaperoned by CMA Deseree Blount ? ?ASSESSMENT/PLAN:  ? ?Papanicolaou smear ?Pap smear performed today with high-risk HPV testing. Results will determine next steps. Vaginal exam today unremarkable. She declined testing for STI. ?-F/u results; can refer to [place] clinic if indicated  ?-Discussed possible next steps with patient depending on results ?  ? ? ?Sharion Settler, DO ?[facility]  ? ?

## 2021-11-07 ENCOUNTER — Other Ambulatory Visit: Payer: Self-pay

## 2021-11-07 ENCOUNTER — Other Ambulatory Visit (HOSPITAL_COMMUNITY)
Admission: RE | Admit: 2021-11-07 | Discharge: 2021-11-07 | Disposition: A | Discharge status: Home / Self Care | Payer: 59 | Source: Ambulatory Visit | Attending: Family Medicine | Admitting: Family Medicine

## 2021-11-07 ENCOUNTER — Encounter: Payer: Self-pay | Admitting: Family Medicine

## 2021-11-07 ENCOUNTER — Ambulatory Visit (INDEPENDENT_AMBULATORY_CARE_PROVIDER_SITE_OTHER): Payer: 59 | Admitting: Family Medicine

## 2021-11-07 VITALS — BP 110/76 | HR 83 | Wt 128.8 lb

## 2021-11-07 DIAGNOSIS — R87618 Other abnormal cytological findings on specimens from cervix uteri: Secondary | ICD-10-CM | POA: Diagnosis not present

## 2021-11-07 DIAGNOSIS — Z124 Encounter for screening for malignant neoplasm of cervix: Secondary | ICD-10-CM | POA: Diagnosis not present

## 2021-11-07 NOTE — Patient Instructions (Addendum)
It was wonderful to see you today. ? ?Please bring ALL of your medications with you to every visit.  ? ?Today we talked about: ? ?-Today we did a Pap smear and testing for HPV. I will let you know what next steps are needed based on the results. Usually it can take about 1 week to return.  ? ? ?Thank you for choosing [facility].  ? ?Please call [phone] with any questions about today's appointment. ? ?Please be sure to schedule follow up at the front  desk before you leave today.  ? ?Sabino Dick, DO ?PGY-2 Family Medicine   ?

## 2021-11-07 NOTE — Assessment & Plan Note (Signed)
Pap smear performed today with high-risk HPV testing. Results will determine next steps. Vaginal exam today unremarkable. She declined testing for STI. ?-F/u results; can refer to [place] clinic if indicated  ?-Discussed possible next steps with patient depending on results ?

## 2021-11-11 LAB — CYTOLOGY - PAP
Comment: NEGATIVE
Diagnosis: UNDETERMINED — AB
High risk HPV: NEGATIVE

## 2021-11-12 ENCOUNTER — Telehealth: Payer: Self-pay | Admitting: Family Medicine

## 2021-11-12 NOTE — Telephone Encounter (Signed)
Patient is returning two missed calls from our office. She is not sure who called and I did not see anything documented in chart. She said she believes it is concerning her last appointment on 11/07/21.  ? ?She would like for whoever called to return her call. The best call back is [phone]. ?

## 2021-11-13 ENCOUNTER — Telehealth: Payer: Self-pay | Admitting: Family Medicine

## 2021-11-13 NOTE — Telephone Encounter (Signed)
Called patient to relay results of Pap. HPV negative with ASCUS. She will need repeat PAP in 1-year.  ? ?She did note that she did not receive insurance card back at last visit. Have sent message to [facility] Admin team and Red Team to follow up.  ?

## 2022-09-15 ENCOUNTER — Ambulatory Visit (INDEPENDENT_AMBULATORY_CARE_PROVIDER_SITE_OTHER): Payer: Self-pay | Admitting: Family Medicine

## 2022-09-15 ENCOUNTER — Encounter: Payer: Self-pay | Admitting: Family Medicine

## 2022-09-15 VITALS — BP 115/70 | HR 68 | Temp 98.4°F | Ht 61.0 in | Wt 130.6 lb

## 2022-09-15 DIAGNOSIS — Z1231 Encounter for screening mammogram for malignant neoplasm of breast: Secondary | ICD-10-CM

## 2022-09-15 DIAGNOSIS — Z0000 Encounter for general adult medical examination without abnormal findings: Secondary | ICD-10-CM

## 2022-09-15 DIAGNOSIS — Z23 Encounter for immunization: Secondary | ICD-10-CM

## 2022-09-15 DIAGNOSIS — E119 Type 2 diabetes mellitus without complications: Secondary | ICD-10-CM

## 2022-09-15 LAB — POCT GLYCOSYLATED HEMOGLOBIN (HGB A1C): HbA1c, POC (controlled diabetic range): 6.5 % (ref 0.0–7.0)

## 2022-09-15 NOTE — Progress Notes (Unsigned)
    SUBJECTIVE:   Chief compliant/HPI: annual examination  Theresa Diaz is a 57 y.o. who presents today for an annual exam.   DM2- A1c today 6.5  Wants a monitor to check blood sugars at home  Needs a refill of metformin and crestor   Pap- 10 mo ago- negative HPV, ASCUS   No concerns about mood   Needs shingles vaccine, flu vaccine    OBJECTIVE:   BP 115/70   Pulse 68   Temp 98.4 F (36.9 C)   Ht 5\' 1"  (1.549 m)   Wt 130 lb 9.6 oz (59.2 kg)   LMP 05/28/2016   SpO2 98%   BMI 24.68 kg/m   BP 115/70  Physical exam General: well appearing, NAD Cardiovascular: RRR, no murmurs Lungs: CTAB. Normal WOB Abdomen: soft, non-distended, non-tender Skin: warm, dry. No edema   ASSESSMENT/PLAN:   No problem-specific Assessment & Plan notes found for this encounter.    Annual Examination  See AVS for age appropriate recommendations  PHQ score 0, reviewed and discussed.  BP reviewed and at goal.   Considered the following items based upon USPSTF recommendations: Diabetes screening: ordered A1c 6.5. Urine Acr checked as well  Screening for elevated cholesterol:  performed less than a year ago GC/CT  not concerned about STI Reviewed risk factors for latent tuberculosis and not indicated  Cervical cancer screening: prior Pap reviewed, repeat due in   Breast cancer screening: discussed potential benefits, risks including overdiagnosis and biopsy, elected to wait until age 21 Colorectal cancer screening: Up to date  Lung cancer screening: not indicated  Vaccinations: flu vaccine.   Follow up in 1  year or sooner if indicated.    [place]

## 2022-09-15 NOTE — Patient Instructions (Addendum)
It was great seeing you today!  You came in for your physical and Im glad you are doing well! Today we did your physical and you got your flu vaccine. Your sugars were normal! Keep up the good work!   Call the breast center to schedule for your mammogram  You can get your shingles vaccine from your pharmacy   Feel free to call with any questions or concerns at any time, at [phone].   Take care,  Dr. Shary Key [facility]

## 2022-09-18 ENCOUNTER — Encounter: Payer: Self-pay | Admitting: Family Medicine

## 2022-09-18 LAB — MICROALBUMIN / CREATININE URINE RATIO
Creatinine, Urine: 23.1 mg/dL
Microalb/Creat Ratio: <13 mg/g creat (ref 0–29)
Microalbumin, Urine: <3 ug/mL

## 2022-10-28 ENCOUNTER — Other Ambulatory Visit: Payer: Self-pay | Admitting: Family Medicine

## 2022-10-28 ENCOUNTER — Ambulatory Visit
Admission: RE | Admit: 2022-10-28 | Discharge: 2022-10-28 | Disposition: A | Discharge status: Home / Self Care | Payer: Self-pay | Source: Ambulatory Visit | Attending: Family Medicine | Admitting: Family Medicine

## 2022-10-28 DIAGNOSIS — Z23 Encounter for immunization: Secondary | ICD-10-CM

## 2022-10-28 DIAGNOSIS — Z1231 Encounter for screening mammogram for malignant neoplasm of breast: Secondary | ICD-10-CM

## 2022-10-28 DIAGNOSIS — Z0000 Encounter for general adult medical examination without abnormal findings: Secondary | ICD-10-CM

## 2022-10-28 DIAGNOSIS — E119 Type 2 diabetes mellitus without complications: Secondary | ICD-10-CM

## 2022-10-29 ENCOUNTER — Telehealth: Payer: Self-pay

## 2022-10-29 NOTE — Telephone Encounter (Signed)
Son stopped by [facility] requesting paperwork.   He reports she was contacted by "someone" however he did not answer and they did not leave a message.   I do not see anything in the chart.   Will forward to PCP.

## 2022-12-27 ENCOUNTER — Other Ambulatory Visit: Payer: Self-pay | Admitting: Student

## 2022-12-27 DIAGNOSIS — E119 Type 2 diabetes mellitus without complications: Secondary | ICD-10-CM

## 2023-06-03 ENCOUNTER — Ambulatory Visit: Payer: Self-pay | Admitting: Family Medicine

## 2023-06-14 NOTE — Progress Notes (Signed)
    SUBJECTIVE:   CHIEF COMPLAINT / HPI:   NL is a 57yo F w/ hx of T2DM, HLD that p/f T2DM f/u  T2DM - taking metformin as directed - does not have an ophthalmologist - not on statin  - Denies tobacco or alcohol use.  - Due for shingles  OBJECTIVE:   BP 108/62   Pulse 73   Wt 128 lb (58.1 kg)   LMP 05/28/2016   SpO2 98%   BMI 24.19 kg/m   General: Alert, pleasant woman. NAD. HEENT: NCAT. MMM. CV: RRR, no murmurs.  Resp: CTAB, no wheezing or crackles. Normal WOB on RA.  Abm: Soft, nontender, nondistended. BS present. Ext: Moves all ext spontaneously Skin: Warm, well perfused   ASSESSMENT/PLAN:   No problem-specific Assessment & Plan notes found for this encounter.   Assessment & Plan Diabetes mellitus without complication (HCC) A1c at goal. - Cont metformin 500mg  daily - Referral to ophthalmology for diabetic eye exam - BMP today Hyperlipidemia, unspecified hyperlipidemia type Lipid panel today. - Cont crestor 10mg  daily  The 10-year ASCVD risk score (Arnett DK, et al., 2019) is: 3.6%   Values used to calculate the score:     Age: 66 years     Sex: Female     Is Non-Hispanic African American: No     Diabetic: Yes     Tobacco smoker: No     Systolic Blood Pressure: 108 mmHg     Is BP treated: No     HDL Cholesterol: 50 mg/dL     Total Cholesterol: 217 mg/dL    Lincoln Brigham, MD [facility]

## 2023-06-15 ENCOUNTER — Encounter: Payer: Self-pay | Admitting: Family Medicine

## 2023-06-15 ENCOUNTER — Ambulatory Visit: Payer: No Typology Code available for payment source | Admitting: Family Medicine

## 2023-06-15 VITALS — BP 108/62 | HR 73 | Wt 128.0 lb

## 2023-06-15 DIAGNOSIS — E119 Type 2 diabetes mellitus without complications: Secondary | ICD-10-CM | POA: Diagnosis not present

## 2023-06-15 DIAGNOSIS — Z23 Encounter for immunization: Secondary | ICD-10-CM | POA: Diagnosis not present

## 2023-06-15 DIAGNOSIS — E785 Hyperlipidemia, unspecified: Secondary | ICD-10-CM | POA: Diagnosis not present

## 2023-06-15 LAB — POCT GLYCOSYLATED HEMOGLOBIN (HGB A1C): HbA1c, POC (controlled diabetic range): 6.7 % (ref 0.0–7.0)

## 2023-06-15 MED ORDER — ZOSTER VAC RECOMB ADJUVANTED 50 MCG/0.5ML IM SUSR: 0.5000 mL | Freq: Once | INTRAMUSCULAR | 1 refills | Status: AC

## 2023-06-15 MED ORDER — ROSUVASTATIN CALCIUM 10 MG PO TABS: 10.0000 mg | ORAL_TABLET | Freq: Every day | ORAL | 3 refills | Status: DC

## 2023-06-15 MED ORDER — METFORMIN HCL 500 MG PO TABS: 500.0000 mg | ORAL_TABLET | Freq: Every day | ORAL | 3 refills | Status: DC

## 2023-06-15 NOTE — Patient Instructions (Signed)
Good to see you today - Thank you for coming in  Things we discussed today:  1) Your diabetes is doing well! Your A1c is 6.7.   2) We will check your electrolytes and cholesterol levels.  3) I sent a prescription for you to get a Shingles vaccine. Go to your pharmacy to get it done. It is one shot now, then a second shot in 2 to 6 months.  4) I will send a referral for you to see an ophthalmologist. We recommend getting your eyes checked once a year.   Come back to see me in 6 months.

## 2023-06-16 LAB — LIPID PANEL
Chol/HDL Ratio: 4.3 ratio (ref 0.0–4.4)
Cholesterol, Total: 217 mg/dL — ABNORMAL HIGH (ref 100–199)
HDL: 50 mg/dL (ref >39)
LDL Chol Calc (NIH): 143 mg/dL — ABNORMAL HIGH (ref 0–99)
Triglycerides: 135 mg/dL (ref 0–149)
VLDL Cholesterol Cal: 24 mg/dL (ref 5–40)

## 2023-06-16 LAB — BASIC METABOLIC PANEL
BUN/Creatinine Ratio: 21 (ref 9–23)
BUN: 12 mg/dL (ref 6–24)
CO2: 21 mmol/L (ref 20–29)
Calcium: 9 mg/dL (ref 8.7–10.2)
Chloride: 107 mmol/L — ABNORMAL HIGH (ref 96–106)
Creatinine, Ser: 0.57 mg/dL (ref 0.57–1.00)
Glucose: 102 mg/dL — ABNORMAL HIGH (ref 70–99)
Potassium: 4.3 mmol/L (ref 3.5–5.2)
Sodium: 144 mmol/L (ref 134–144)
eGFR: 107 mL/min/{1.73_m2} (ref >59)

## 2023-06-24 ENCOUNTER — Encounter: Payer: Self-pay | Admitting: Family Medicine

## 2023-06-24 ENCOUNTER — Telehealth: Payer: Self-pay | Admitting: Family Medicine

## 2023-06-24 DIAGNOSIS — E785 Hyperlipidemia, unspecified: Secondary | ICD-10-CM

## 2023-06-24 MED ORDER — ROSUVASTATIN CALCIUM 20 MG PO TABS: 20.0000 mg | ORAL_TABLET | Freq: Every day | ORAL | 3 refills | Status: DC

## 2023-06-24 NOTE — Telephone Encounter (Signed)
Attempted to call pt but no answer, so left VM.  I wanted to discuss that her cholesterol levels were high at her last visit. I recommend increasing her rosuvastatin from 10mg  to 20mg  daily. I will send this prescription in now. She should follow-up in 3 months to recheck her cholesterol.

## 2023-10-23 ENCOUNTER — Other Ambulatory Visit: Payer: Self-pay | Admitting: Family Medicine

## 2023-10-23 DIAGNOSIS — E785 Hyperlipidemia, unspecified: Secondary | ICD-10-CM

## 2023-11-23 ENCOUNTER — Other Ambulatory Visit: Payer: Self-pay | Admitting: Family Medicine

## 2023-11-23 DIAGNOSIS — Z1231 Encounter for screening mammogram for malignant neoplasm of breast: Secondary | ICD-10-CM

## 2023-11-29 ENCOUNTER — Ambulatory Visit

## 2023-12-21 ENCOUNTER — Ambulatory Visit: Admitting: Family Medicine

## 2023-12-21 ENCOUNTER — Encounter: Payer: Self-pay | Admitting: Family Medicine

## 2023-12-21 VITALS — BP 114/69 | HR 77 | Ht 61.0 in | Wt 135.0 lb

## 2023-12-21 DIAGNOSIS — Z1231 Encounter for screening mammogram for malignant neoplasm of breast: Secondary | ICD-10-CM | POA: Diagnosis not present

## 2023-12-21 DIAGNOSIS — E119 Type 2 diabetes mellitus without complications: Secondary | ICD-10-CM | POA: Diagnosis not present

## 2023-12-21 DIAGNOSIS — Z23 Encounter for immunization: Secondary | ICD-10-CM

## 2023-12-21 LAB — POCT GLYCOSYLATED HEMOGLOBIN (HGB A1C): HbA1c, POC (controlled diabetic range): 6.8 % (ref 0.0–7.0)

## 2023-12-21 NOTE — Patient Instructions (Signed)
 Good to see you today - Thank you for coming in  Things we discussed today:  1) Your A1c today is 6.8. This is good!  2) Your insurance should accept [facility]. Here are other locations in the area that you can call and see if they take your insurance.   [facility] [place] [phone] [address] [place], [place] [address]  [facility] - [facility] [phone] [address] [place], [place] [address]  [facility]  [phone]   Come back to see me in 6 months.

## 2023-12-21 NOTE — Progress Notes (Signed)
    SUBJECTIVE:   CHIEF COMPLAINT / HPI:   NL is a 58yo F w/ hx of HTN and T2DM hf follow-up. - Reports continuing to take meds as directed. Denies side effects.   Health Maintenance: - Tried to get mammogram at [facility], but was told her insurance was not accepted there  OBJECTIVE:   BP 114/69   Pulse 77   Ht 5\' 1"  (1.549 m)   Wt 135 lb (61.2 kg)   LMP 05/28/2016   SpO2 98%   BMI 25.51 kg/m   General: Alert, pleasant well appearing woman. NAD. HEENT: NCAT. MMM. CV: RRR, no murmurs.   Resp: CTAB, no wheezing or crackles. Normal WOB on RA.  Abm: Soft, nontender, nondistended. BS present. Ext: Moves all ext spontaneously Skin: Warm, well perfused   ASSESSMENT/PLAN:   Assessment & Plan Type 2 diabetes mellitus without complication, without long-term current use of insulin (HCC) A1c 6.8. At goal. - UACR today wnl Encounter for immunization - Prevnar 20 given today Encounter for screening mammogram for malignant neoplasm of breast From online search, it seems that [employer] should be accepted at [facility], but also provided info for other local mammogram providers.   Albin Huh, MD [facility]

## 2023-12-22 ENCOUNTER — Ambulatory Visit: Payer: Self-pay | Admitting: Family Medicine

## 2023-12-22 LAB — MICROALBUMIN / CREATININE URINE RATIO
Creatinine, Urine: 36.9 mg/dL
Microalb/Creat Ratio: <8 mg/g{creat} (ref 0–29)
Microalbumin, Urine: <3 ug/mL

## 2023-12-23 NOTE — Assessment & Plan Note (Signed)
 A1c 6.8. At goal. - UACR today wnl

## 2024-02-03 LAB — HM DIABETES EYE EXAM

## 2024-05-05 ENCOUNTER — Ambulatory Visit: Admitting: Family Medicine

## 2024-05-05 ENCOUNTER — Encounter: Payer: Self-pay | Admitting: Family Medicine

## 2024-05-05 VITALS — BP 130/67 | HR 84 | Ht 61.0 in | Wt 184.0 lb

## 2024-05-05 DIAGNOSIS — R21 Rash and other nonspecific skin eruption: Secondary | ICD-10-CM | POA: Diagnosis not present

## 2024-05-05 MED ORDER — TRIAMCINOLONE ACETONIDE 0.5 % EX OINT: 1.0000 | TOPICAL_OINTMENT | Freq: Two times a day (BID) | CUTANEOUS | 0 refills | Status: AC

## 2024-05-05 NOTE — Patient Instructions (Signed)
 1) For your rash, it looks like an allergic reaction from a bug bite or other irritant. - Apply triamcinolone  cream twice a day until the rash goes away  Please come back if the rash is: - growing - becomes more painful - Not getting better in 1 week - Starts to grow new bumps  - bleeding

## 2024-05-05 NOTE — Progress Notes (Signed)
    SUBJECTIVE:   CHIEF COMPLAINT / HPI:   NL is a 58yo F that pf rash - Reports she yesterday afternoon, she notice a mark on her R forearm. It slowly started to spread. She initially thought it was a bug bite, although does not remember any bite marks or seeing a bug.  - The rash felt hot and itchy.  - Mildly sore to palpation, but mainly itchy - Denies hx of allergies. No SOB, fevers.  - Denies new soaps or exposures. - Denies bleeding, - Reports the rash was flat, denies any rasied lesions or bumps  PERTINENT  PMH / PSH: T2DM  OBJECTIVE:   BP 130/67   Pulse 84   Ht 5' 1 (1.549 m)   Wt 184 lb (83.5 kg)   LMP 05/28/2016   SpO2 98%   BMI 34.77 kg/m   General: Alert, pleasant woman. NAD. HEENT: NCAT. MMM. CV: RRR, no murmurs.  Resp: CTAB, no wheezing or crackles. Normal WOB on RA. Ext: Moves all ext spontaneously Skin:  Flat, poorly demarcated erythematous rash on R forearm. Non indurated, nontender.    ASSESSMENT/PLAN:   Assessment & Plan Rash Most likely hypersensitive reaction such as bug bite or irritatant since primary sx is pruritus. Less likely cellulitis or lyme disease given lack of tenderness, no known bug bite.  - Topical triamcinolone  BID for itching - Drew marker line around rash. Advised to take pics on phone daily to monitor rash - Counseled on return and ED precautions.    Twyla Nearing, MD [facility]

## 2024-06-21 ENCOUNTER — Other Ambulatory Visit: Payer: Self-pay | Admitting: Family Medicine

## 2024-06-21 DIAGNOSIS — E785 Hyperlipidemia, unspecified: Secondary | ICD-10-CM

## 2024-06-21 DIAGNOSIS — E119 Type 2 diabetes mellitus without complications: Secondary | ICD-10-CM
